# Patient Record
Sex: Female | Born: 1970
Health system: Southern US, Community
[De-identification: ages and names within clinical notes are randomized; demographics above are authoritative.]

## PROBLEM LIST (undated history)

## (undated) DIAGNOSIS — R112 Nausea with vomiting, unspecified: Secondary | ICD-10-CM

## (undated) DIAGNOSIS — B019 Varicella without complication: Secondary | ICD-10-CM

## (undated) DIAGNOSIS — Z9889 Other specified postprocedural states: Secondary | ICD-10-CM

## (undated) HISTORY — DX: Other specified postprocedural states: Z98.890

## (undated) HISTORY — DX: Varicella without complication: B01.9

## (undated) HISTORY — PX: TUBAL LIGATION: SHX77

## (undated) HISTORY — PX: HERNIA REPAIR: SHX51

## (undated) HISTORY — DX: Other specified postprocedural states: R11.2

---

## 1999-02-14 ENCOUNTER — Other Ambulatory Visit: Admission: RE | Admit: 1999-02-14 | Discharge: 1999-02-14 | Payer: Self-pay | Admitting: Obstetrics and Gynecology

## 1999-03-08 ENCOUNTER — Ambulatory Visit (HOSPITAL_BASED_OUTPATIENT_CLINIC_OR_DEPARTMENT_OTHER): Admission: RE | Admit: 1999-03-08 | Discharge: 1999-03-08 | Payer: Self-pay | Admitting: *Deleted

## 2002-05-29 ENCOUNTER — Other Ambulatory Visit: Admission: RE | Admit: 2002-05-29 | Discharge: 2002-05-29 | Payer: Self-pay | Admitting: Obstetrics and Gynecology

## 2004-08-09 ENCOUNTER — Ambulatory Visit: Payer: Self-pay | Admitting: Family Medicine

## 2004-08-09 ENCOUNTER — Other Ambulatory Visit: Admission: RE | Admit: 2004-08-09 | Discharge: 2004-08-09 | Payer: Self-pay | Admitting: Family Medicine

## 2005-02-08 ENCOUNTER — Ambulatory Visit: Payer: Self-pay | Admitting: Family Medicine

## 2012-04-01 ENCOUNTER — Encounter: Payer: Self-pay | Admitting: Family Medicine

## 2012-04-01 ENCOUNTER — Ambulatory Visit (INDEPENDENT_AMBULATORY_CARE_PROVIDER_SITE_OTHER): Payer: BC Managed Care – PPO | Admitting: Family Medicine

## 2012-04-01 VITALS — BP 128/76 | HR 100 | Temp 98.2°F | Ht 62.75 in | Wt 133.2 lb

## 2012-04-01 DIAGNOSIS — Z0000 Encounter for general adult medical examination without abnormal findings: Secondary | ICD-10-CM

## 2012-04-01 NOTE — Progress Notes (Signed)
  Subjective:    Patient ID: Jennifer Willis, female    DOB: 03-24-1971, 41 y.o.   MRN: [id number]  HPI New to establish.  Has not seen MD in 6-7 yrs.  Overdue on pap and mammo.  No concerns today.   Review of Systems Patient reports no vision/ hearing changes, adenopathy,fever, weight change,  persistant/recurrent hoarseness , swallowing issues, chest pain, palpitations, edema, persistant/recurrent cough, hemoptysis, dyspnea (rest/exertional/paroxysmal nocturnal), gastrointestinal bleeding (melena, rectal bleeding), abdominal pain, significant heartburn, bowel changes, GU symptoms (dysuria, hematuria, incontinence), Gyn symptoms (abnormal  bleeding, pain),  syncope, focal weakness, memory loss, numbness & tingling, skin/hair/nail changes, abnormal bruising or bleeding, anxiety, or depression.     Objective:   Physical Exam General Appearance:    Alert, cooperative, no distress, appears stated age  Head:    Normocephalic, without obvious abnormality, atraumatic  Eyes:    PERRL, conjunctiva/corneas clear, EOM's intact, fundi    benign, both eyes  Ears:    Normal TM's and external ear canals, both ears  Nose:   Nares normal, septum midline, mucosa normal, no drainage    or sinus tenderness  Throat:   Lips, mucosa, and tongue normal; teeth and gums normal  Neck:   Supple, symmetrical, trachea midline, no adenopathy;    Thyroid: no enlargement/tenderness/nodules  Back:     Symmetric, no curvature, ROM normal, no CVA tenderness  Lungs:     Clear to auscultation bilaterally, respirations unlabored  Chest Wall:    No tenderness or deformity   Heart:    Regular rate and rhythm, S1 and S2 normal, no murmur, rub   or gallop  Breast Exam:    Deferred to upcoming appt  Abdomen:     Soft, non-tender, bowel sounds active all four quadrants,    no masses, no organomegaly  Genitalia:    Deferred to upcoming appt  Rectal:    Extremities:   Extremities normal, atraumatic, no cyanosis or edema  Pulses:    2+ and symmetric all extremities  Skin:   Skin color, texture, turgor normal, no rashes or lesions  Lymph nodes:   Cervical, supraclavicular, and axillary nodes normal  Neurologic:   CNII-XII intact, normal strength, sensation and reflexes    throughout          Assessment & Plan:

## 2012-04-01 NOTE — Assessment & Plan Note (Signed)
New.  Pt's PE WNL.  Due for pap and mammo but will hold off as this is our first visit.  Check labs.  Anticipatory guidance provided.

## 2012-04-01 NOTE — Patient Instructions (Addendum)
Schedule your pap at your convenience We'll notify you of your lab results Keep up the good work!  You look great! Think of [place] as your home base Welcome!  We're glad to have you!!!

## 2012-04-02 LAB — CBC WITH DIFFERENTIAL/PLATELET
Basophils Relative: 0.8 % (ref 0.0–3.0)
Eosinophils Relative: 0.9 % (ref 0.0–5.0)
Lymphocytes Relative: 27.6 % (ref 12.0–46.0)
MCV: 76.2 fl — ABNORMAL LOW (ref 78.0–100.0)
Monocytes Relative: 7.2 % (ref 3.0–12.0)
Neutrophils Relative %: 63.5 % (ref 43.0–77.0)
Platelets: 354 10*3/uL (ref 150.0–400.0)
RBC: 4.59 Mil/uL (ref 3.87–5.11)
WBC: 8.2 10*3/uL (ref 4.5–10.5)

## 2012-04-02 LAB — HEPATIC FUNCTION PANEL
ALT: 29 U/L (ref 0–35)
AST: 27 U/L (ref 0–37)
Albumin: 4.3 g/dL (ref 3.5–5.2)
Alkaline Phosphatase: 53 U/L (ref 39–117)

## 2012-04-02 LAB — TSH: TSH: 1.3 u[IU]/mL (ref 0.35–5.50)

## 2012-04-02 LAB — BASIC METABOLIC PANEL
BUN: 15 mg/dL (ref 6–23)
CO2: 21 mEq/L (ref 19–32)
Chloride: 103 mEq/L (ref 96–112)
GFR: 99.03 mL/min (ref >60.00)
Glucose, Bld: 64 mg/dL — ABNORMAL LOW (ref 70–99)
Potassium: 3.2 mEq/L — ABNORMAL LOW (ref 3.5–5.1)
Sodium: 135 mEq/L (ref 135–145)

## 2012-04-02 LAB — LIPID PANEL: HDL: 54.5 mg/dL (ref >39.00)

## 2012-04-05 LAB — VITAMIN D 1,25 DIHYDROXY
Vitamin D 1, 25 (OH)2 Total: 74 pg/mL — ABNORMAL HIGH (ref 18–72)
Vitamin D2 1, 25 (OH)2: <8 pg/mL
Vitamin D3 1, 25 (OH)2: 74 pg/mL

## 2012-06-17 ENCOUNTER — Encounter: Payer: Self-pay | Admitting: Family Medicine

## 2012-06-17 ENCOUNTER — Ambulatory Visit (INDEPENDENT_AMBULATORY_CARE_PROVIDER_SITE_OTHER): Payer: BC Managed Care – PPO | Admitting: Family Medicine

## 2012-06-17 ENCOUNTER — Other Ambulatory Visit (HOSPITAL_COMMUNITY)
Admission: RE | Admit: 2012-06-17 | Discharge: 2012-06-17 | Disposition: A | Discharge status: Home / Self Care | Payer: BC Managed Care – PPO | Source: Ambulatory Visit | Attending: Family Medicine | Admitting: Family Medicine

## 2012-06-17 VITALS — BP 118/78 | HR 88 | Temp 98.2°F | Ht 62.75 in | Wt 131.6 lb

## 2012-06-17 DIAGNOSIS — Z1239 Encounter for other screening for malignant neoplasm of breast: Secondary | ICD-10-CM

## 2012-06-17 DIAGNOSIS — Z01419 Encounter for gynecological examination (general) (routine) without abnormal findings: Secondary | ICD-10-CM | POA: Insufficient documentation

## 2012-06-17 DIAGNOSIS — N920 Excessive and frequent menstruation with regular cycle: Secondary | ICD-10-CM

## 2012-06-17 DIAGNOSIS — Z124 Encounter for screening for malignant neoplasm of cervix: Secondary | ICD-10-CM

## 2012-06-17 DIAGNOSIS — Z1231 Encounter for screening mammogram for malignant neoplasm of breast: Secondary | ICD-10-CM

## 2012-06-17 NOTE — Assessment & Plan Note (Signed)
Breast and pelvic exam performed.  Referral for mammo made and discussed w/ pt.

## 2012-06-17 NOTE — Assessment & Plan Note (Signed)
Pap collected. 

## 2012-06-17 NOTE — Progress Notes (Signed)
  Subjective:    Patient ID: Jennifer Willis, female    DOB: [birth year], 41 y.o.   MRN: [id number]  HPI Here today for breast and pelvic exam.  Pt reports heavier than normal periods since starting iron.   Review of Systems Pt denies breast lump, skin changes, nipple discharge, discomfort.  No vaginal discharge, pain, mass, abnormality.    Objective:   Physical Exam  Vitals reviewed. Constitutional: She appears well-developed and well-nourished. No distress.  Pulmonary/Chest: Right breast exhibits no inverted nipple, no mass, no nipple discharge, no skin change and no tenderness. Left breast exhibits no inverted nipple, no mass, no nipple discharge, no skin change and no tenderness.  Genitourinary: There is no rash, tenderness or lesion on the right labia. There is no rash, tenderness or lesion on the left labia. Uterus is not deviated, not enlarged, not fixed and not tender. Cervix exhibits friability. Cervix exhibits no motion tenderness and no discharge. Right adnexum displays no mass, no tenderness and no fullness. Left adnexum displays no mass, no tenderness and no fullness. No erythema, tenderness or bleeding around the vagina. No foreign body around the vagina. No signs of injury around the vagina. No vaginal discharge found.          Assessment & Plan:

## 2012-06-17 NOTE — Patient Instructions (Addendum)
Follow up in 1 year or as needed We'll notify you of your pap result Someone will call you with your mammogram appt Call with any questions or concerns Happy Fall!!

## 2012-10-18 ENCOUNTER — Encounter: Payer: Self-pay | Admitting: Family Medicine

## 2012-10-18 ENCOUNTER — Ambulatory Visit (INDEPENDENT_AMBULATORY_CARE_PROVIDER_SITE_OTHER): Payer: BC Managed Care – PPO | Admitting: Family Medicine

## 2012-10-18 VITALS — BP 130/90 | HR 123 | Temp 101.7°F | Ht 62.5 in | Wt 133.2 lb

## 2012-10-18 DIAGNOSIS — J111 Influenza due to unidentified influenza virus with other respiratory manifestations: Secondary | ICD-10-CM

## 2012-10-18 LAB — POCT INFLUENZA A/B
Influenza A, POC: NEGATIVE
Influenza B, POC: NEGATIVE

## 2012-10-18 MED ORDER — OSELTAMIVIR PHOSPHATE 75 MG PO CAPS: 75.0000 mg | ORAL_CAPSULE | Freq: Two times a day (BID) | ORAL | Status: DC

## 2012-10-18 NOTE — Patient Instructions (Addendum)
This is a flu like illness Start the Tamiflu twice daily for 5 days Alternate tylenol and ibuprofen every 4 hrs for pain and fever relief Mucinex DM for cough Drink plenty of fluids REST! Hang in there!!

## 2012-10-18 NOTE — Progress Notes (Signed)
  Subjective:    Patient ID: Jennifer Willis, female    DOB: 08-03-1971, 42 y.o.   MRN: [id number]  HPI Cough- sxs started Tuesday.  + body aches, cough, fever.  Bilateral ear pain.  + HA.  Taking dayquil/nyquil.  No known sick contacts.  No N/V/D.  Denies sinus pain/pressure.   Review of Systems For ROS see HPI     Objective:   Physical Exam  Vitals reviewed. Constitutional: She appears well-developed and well-nourished. No distress.  Obviously not feeling well  HENT:  Head: Normocephalic and atraumatic.  Right Ear: Tympanic membrane normal.  Left Ear: Tympanic membrane normal.  Nose: Mucosal edema and rhinorrhea present. Right sinus exhibits no maxillary sinus tenderness and no frontal sinus tenderness. Left sinus exhibits no maxillary sinus tenderness and no frontal sinus tenderness.  Mouth/Throat: Uvula is midline and mucous membranes are normal. Posterior oropharyngeal erythema present. No oropharyngeal exudate.  Eyes: Conjunctivae and EOM are normal. Pupils are equal, round, and reactive to light.  Neck: Normal range of motion. Neck supple.  Cardiovascular: Normal rate, regular rhythm and normal heart sounds.   Pulmonary/Chest: Effort normal and breath sounds normal. No respiratory distress. She has no wheezes.  + dry cough  Lymphadenopathy:    She has no cervical adenopathy.  Skin: Skin is warm.          Assessment & Plan:

## 2012-10-20 NOTE — Assessment & Plan Note (Signed)
New.  Despite negative flu test, pt's sxs and lack of obvious bacterial infxn on PE consistent w/ ILI.  Start Tamiflu.  Reviewed supportive care and red flags that should prompt return.  Pt expressed understanding and is in agreement w/ plan.

## 2014-05-04 ENCOUNTER — Encounter: Payer: Self-pay | Admitting: Physician Assistant

## 2014-05-04 ENCOUNTER — Ambulatory Visit (INDEPENDENT_AMBULATORY_CARE_PROVIDER_SITE_OTHER): Payer: BC Managed Care – PPO | Admitting: Physician Assistant

## 2014-05-04 VITALS — BP 161/107 | HR 101 | Temp 98.7°F | Resp 16 | Ht 62.5 in | Wt 129.4 lb

## 2014-05-04 DIAGNOSIS — M5430 Sciatica, unspecified side: Secondary | ICD-10-CM

## 2014-05-04 DIAGNOSIS — M5432 Sciatica, left side: Secondary | ICD-10-CM

## 2014-05-04 MED ORDER — TRAMADOL HCL 50 MG PO TABS: ORAL_TABLET | ORAL | Status: DC

## 2014-05-04 MED ORDER — METHYLPREDNISOLONE ACETATE 80 MG/ML IJ SUSP
60.0000 mg | Freq: Once | INTRAMUSCULAR | Status: AC
  Administered 2014-05-04: 60 mg via INTRAMUSCULAR

## 2014-05-04 NOTE — Progress Notes (Signed)
Pre visit review using our clinic review tool, if applicable. No additional management support is needed unless otherwise documented below in the visit note/SLS  

## 2014-05-04 NOTE — Addendum Note (Signed)
Addended by: Rockwell Germany on: 05/04/2014 03:55 PM   Modules accepted: Orders

## 2014-05-04 NOTE — Progress Notes (Signed)
Patient presents to clinic today c/o left sided low back pain with radiation into the LLE.  Denies trauma or injury. Symptoms started the day after she worked in her garden.  Denies saddle anesthesia or change to bowel/bladder habits. Endorses some mild weakness of LLE but denies numbness or tingling of extremity.  Past Medical History  Diagnosis Date  . Chicken pox     No current outpatient prescriptions on file prior to visit.   No current facility-administered medications on file prior to visit.    No Known Allergies  Family History  Problem Relation Age of Onset  . Arthritis Mother   . Hypertension Mother     History   Social History  . Marital Status: Married    Spouse Name: N/A    Number of Children: N/A  . Years of Education: N/A   Social History Main Topics  . Smoking status: Never Smoker   . Smokeless tobacco: None  . Alcohol Use: No  . Drug Use: No  . Sexual Activity: None   Other Topics Concern  . None   Social History Narrative  . None   Review of Systems - See HPI.  All other ROS are negative.  BP 161/107  Pulse 101  Temp(Src) 98.7 F (37.1 C) (Oral)  Resp 16  Ht 5' 2.5" (1.588 m)  Wt 129 lb 6 oz (58.684 kg)  BMI 23.27 kg/m2  SpO2 100%  LMP 05/02/2014  Physical Exam  Vitals reviewed. Constitutional: She is oriented to person, place, and time and well-developed, well-nourished, and in no distress.  HENT:  Head: Normocephalic and atraumatic.  Cardiovascular: Normal rate, regular rhythm, normal heart sounds and intact distal pulses.   Pulmonary/Chest: Effort normal and breath sounds normal. No respiratory distress. She has no wheezes. She has no rales. She exhibits no tenderness.  Musculoskeletal:       Cervical back: Normal.       Thoracic back: Normal.       Lumbar back: She exhibits tenderness and pain. She exhibits no bony tenderness, no swelling and no spasm.  Neurological: She is alert and oriented to person, place, and time. She has  normal reflexes.  Skin: Skin is warm and dry. No rash noted.  Psychiatric: Affect normal.    No results found for this or any previous visit (from the past 2160 hour(s)).  Assessment/Plan: Sciatica 60 mg IM Depo medrol given by nursing staff.  Rx Tramadol.  Avoid heavy lifting or overexertion.  Topical Aspercreme and Heating pad. Follow-up in 1 week if no improvement.

## 2014-05-04 NOTE — Assessment & Plan Note (Signed)
60 mg IM Depo medrol given by nursing staff.  Rx Tramadol.  Avoid heavy lifting or overexertion.  Topical Aspercreme and Heating pad. Follow-up in 1 week if no improvement.

## 2014-05-04 NOTE — Patient Instructions (Signed)
Please take Tramadol as directed.  Avoid heavy lifting or overexertion.  Alternate Ice/Heat to the area.  Apply topical Aspercreme.  The steroid shot given should help calm down the nerve irritation I believe you are experiencing.  Follow-up in 1 weeks if symptoms still present.

## 2014-05-18 ENCOUNTER — Ambulatory Visit (INDEPENDENT_AMBULATORY_CARE_PROVIDER_SITE_OTHER): Payer: BC Managed Care – PPO | Admitting: Physician Assistant

## 2014-05-18 ENCOUNTER — Ambulatory Visit (HOSPITAL_BASED_OUTPATIENT_CLINIC_OR_DEPARTMENT_OTHER)
Admission: RE | Admit: 2014-05-18 | Discharge: 2014-05-18 | Disposition: A | Discharge status: Home / Self Care | Payer: BC Managed Care – PPO | Source: Ambulatory Visit | Attending: Physician Assistant | Admitting: Physician Assistant

## 2014-05-18 VITALS — BP 159/91 | HR 102 | Temp 98.4°F | Resp 14 | Ht 62.5 in | Wt 129.2 lb

## 2014-05-18 DIAGNOSIS — M25559 Pain in unspecified hip: Secondary | ICD-10-CM | POA: Insufficient documentation

## 2014-05-18 DIAGNOSIS — M238X9 Other internal derangements of unspecified knee: Secondary | ICD-10-CM | POA: Insufficient documentation

## 2014-05-18 DIAGNOSIS — M25552 Pain in left hip: Secondary | ICD-10-CM

## 2014-05-18 DIAGNOSIS — M25362 Other instability, left knee: Secondary | ICD-10-CM

## 2014-05-18 NOTE — Patient Instructions (Signed)
Please continue tramadol as needed for pain.  Wear elastic knee brace to add stabilization and compression to knee.  Avoid heavy lifting.    Please go downstairs for imaging.  I will call you with your results.  I am setting you up with a Sports Medicine doctor for further evaluation and management.

## 2014-05-18 NOTE — Progress Notes (Signed)
    Patient presents to clinic today c/o continued left hip and knee pain.  Endorses buckling of her left knee with prolonged standing.  Denies numbness of lower extremity.  Denies low back pain, stating it has resolved with steroid treatment. Denies trauma or injury.  Denies saddle anesthesia.  Denies change to bowel or bladder habits.  Past Medical History  Diagnosis Date  . Chicken pox     Current Outpatient Prescriptions on File Prior to Visit  Medication Sig Dispense Refill  . aspirin 81 MG tablet Take 81 mg by mouth daily.      Marland Kitchen FIBER SELECT GUMMIES PO Take by mouth daily.      . Ibuprofen-Diphenhydramine Cit (MOTRIN PM) 200-38 MG TABS Take by mouth at bedtime.      . Multiple Vitamins-Minerals (MULTIVITAMIN GUMMIES ADULT PO) Take by mouth.      . traMADol (ULTRAM) 50 MG tablet 1-2 tabs by mouth Q8 hours, maximum 6 tabs per day.  45 tablet  0   No current facility-administered medications on file prior to visit.    No Known Allergies  Family History  Problem Relation Age of Onset  . Arthritis Mother   . Hypertension Mother     History   Social History  . Marital Status: Married    Spouse Name: N/A    Number of Children: N/A  . Years of Education: N/A   Social History Main Topics  . Smoking status: Never Smoker   . Smokeless tobacco: Not on file  . Alcohol Use: No  . Drug Use: No  . Sexual Activity: Not on file   Other Topics Concern  . Not on file   Social History Narrative  . No narrative on file   Review of Systems - See HPI.  All other ROS are negative.  BP 159/91  Pulse 102  Temp(Src) 98.4 F (36.9 C) (Oral)  Resp 14  Ht 5' 2.5" (1.588 m)  Wt 129 lb 4 oz (58.627 kg)  BMI 23.25 kg/m2  SpO2 100%  LMP 05/02/2014  Physical Exam  Vitals reviewed. Constitutional: She is oriented to person, place, and time and well-developed, well-nourished, and in no distress.  HENT:  Head: Normocephalic and atraumatic.  Eyes: Conjunctivae are normal.  Neck:  Neck supple.  Cardiovascular: Normal rate, regular rhythm, normal heart sounds and intact distal pulses.   Pulmonary/Chest: Effort normal and breath sounds normal. No respiratory distress. She has no wheezes. She has no rales. She exhibits no tenderness.  Musculoskeletal:       Left hip: She exhibits normal range of motion, normal strength, no tenderness and no bony tenderness.       Left knee: She exhibits normal range of motion and no swelling. Tenderness found. Medial joint line tenderness noted.  Neurological: She is alert and oriented to person, place, and time.  Skin: Skin is warm and dry. No rash noted.  Psychiatric: Affect normal.   Assessment/Plan: Hip pain, acute Will obtain x-ray of left hip to further assess. Continue Tramadol for pain.  Referral placed to Sports medicine for further evaluation.  Knee buckling Will obtain x-ray of left knee.  Bracing applied.  RICE therapy.  Tramadol for pain.  Referral placed to Sports medicine for further assessment.  May also consider MRI pending X-ray results.

## 2014-05-18 NOTE — Progress Notes (Signed)
Pre visit review using our clinic review tool, if applicable. No additional management support is needed unless otherwise documented below in the visit note/SLS  

## 2014-05-19 ENCOUNTER — Encounter: Payer: Self-pay | Admitting: Physician Assistant

## 2014-05-19 DIAGNOSIS — M25559 Pain in unspecified hip: Secondary | ICD-10-CM | POA: Insufficient documentation

## 2014-05-19 DIAGNOSIS — M25369 Other instability, unspecified knee: Secondary | ICD-10-CM | POA: Insufficient documentation

## 2014-05-19 NOTE — Assessment & Plan Note (Signed)
Will obtain x-ray of left hip to further assess. Continue Tramadol for pain.  Referral placed to Sports medicine for further evaluation.

## 2014-05-19 NOTE — Assessment & Plan Note (Signed)
Will obtain x-ray of left knee.  Bracing applied.  RICE therapy.  Tramadol for pain.  Referral placed to Sports medicine for further assessment.  May also consider MRI pending X-ray results.

## 2014-05-21 ENCOUNTER — Ambulatory Visit: Payer: BC Managed Care – PPO | Admitting: Family Medicine

## 2014-05-25 ENCOUNTER — Ambulatory Visit (INDEPENDENT_AMBULATORY_CARE_PROVIDER_SITE_OTHER): Payer: BC Managed Care – PPO | Admitting: Family Medicine

## 2014-05-25 ENCOUNTER — Encounter: Payer: Self-pay | Admitting: Family Medicine

## 2014-05-25 ENCOUNTER — Ambulatory Visit (HOSPITAL_BASED_OUTPATIENT_CLINIC_OR_DEPARTMENT_OTHER)
Admission: RE | Admit: 2014-05-25 | Discharge: 2014-05-25 | Disposition: A | Discharge status: Home / Self Care | Payer: BC Managed Care – PPO | Source: Ambulatory Visit | Attending: Family Medicine | Admitting: Family Medicine

## 2014-05-25 VITALS — BP 150/98 | HR 106 | Ht 62.0 in | Wt 130.0 lb

## 2014-05-25 DIAGNOSIS — M25552 Pain in left hip: Secondary | ICD-10-CM | POA: Diagnosis not present

## 2014-05-25 DIAGNOSIS — M79605 Pain in left leg: Secondary | ICD-10-CM | POA: Insufficient documentation

## 2014-05-25 DIAGNOSIS — M6281 Muscle weakness (generalized): Secondary | ICD-10-CM | POA: Insufficient documentation

## 2014-05-25 DIAGNOSIS — R29898 Other symptoms and signs involving the musculoskeletal system: Secondary | ICD-10-CM

## 2014-05-25 NOTE — Patient Instructions (Signed)
I'm concerned you have a pinched nerve in your back that is causing severe weakness in your left leg and loss of a reflex at your knee. It's important we assess this with an MRI of your back to make sure it's nothing that needs surgery. Get x-rays before you leave today. Follow up with me the day after your MRI to go over the results and talk about next steps.

## 2014-05-26 ENCOUNTER — Encounter: Payer: Self-pay | Admitting: Family Medicine

## 2014-05-26 DIAGNOSIS — R29898 Other symptoms and signs involving the musculoskeletal system: Secondary | ICD-10-CM | POA: Insufficient documentation

## 2014-05-26 NOTE — Progress Notes (Addendum)
Patient ID: Jennifer Willis, female   DOB: [birth year], 43 y.o.   MRN: [id number]  PCP: Annye Asa, MD Referred by:  Elyn Aquas PA  Subjective:   HPI: Patient is a 43 y.o. female here for left leg, knee buckling.  Patient reports pain started around Labor Day when she felt pain in left buttocks. Pain worsened that week and radiated down to toes. No numbness/tingling. No bowel/bladder dysfunction. Saw Elyn Aquas - had an IM steroid injection and reports her pain has resolved but major issue now is weakness in left leg, left knee buckling. Quad downwards feels weak. Has a knee brace but not currently using. No catching, locking of knee. Radiographs of left hip and knee only showed mild medial arthritis of knee.  Past Medical History  Diagnosis Date  . Chicken pox     Current Outpatient Prescriptions on File Prior to Visit  Medication Sig Dispense Refill  . aspirin 81 MG tablet Take 81 mg by mouth daily.      Marland Kitchen FIBER SELECT GUMMIES PO Take by mouth daily.      . Ibuprofen-Diphenhydramine Cit (MOTRIN PM) 200-38 MG TABS Take by mouth at bedtime.      . Multiple Vitamins-Minerals (MULTIVITAMIN GUMMIES ADULT PO) Take by mouth.      . traMADol (ULTRAM) 50 MG tablet 1-2 tabs by mouth Q8 hours, maximum 6 tabs per day.  45 tablet  0   No current facility-administered medications on file prior to visit.    History reviewed. No pertinent past surgical history.  No Known Allergies  History   Social History  . Marital Status: Married    Spouse Name: N/A    Number of Children: N/A  . Years of Education: N/A   Occupational History  . Not on file.   Social History Main Topics  . Smoking status: Never Smoker   . Smokeless tobacco: Not on file  . Alcohol Use: No  . Drug Use: No  . Sexual Activity: Not on file   Other Topics Concern  . Not on file   Social History Narrative  . No narrative on file    Family History  Problem Relation Age of Onset  . Arthritis Mother    . Hypertension Mother     BP 150/98  Pulse 106  Ht 5\' 2"  (1.575 m)  Wt 130 lb (58.968 kg)  BMI 23.77 kg/m2  LMP 05/02/2014  Review of Systems: See HPI above.    Objective:  Physical Exam:  Gen: NAD  Back: No gross deformity, scoliosis. No TTP .  No midline or bony TTP. FROM without pain. Strength 3+/5 with left hip flexion and knee extension.  5/5 all other bilateral lower extremity groups. Absent left patellar reflex. 2+ right patellar, bilateral achilles tendon reflexes. Negative SLRs. Sensation intact to light touch bilaterally. Negative logroll bilateral hips Negative fabers and piriformis stretches.  Left knee: No gross deformity, ecchymoses, swelling.  No TTP. FROM. Negative ant/post drawers. Negative valgus/varus testing. Negative lachmanns. Negative mcmurrays, apleys, patellar apprehension.    Assessment & Plan:  1. Left leg weakness - given initial history of pain in left side of low back/buttocks radiating to foot and obvious current weakness of hip flexion and knee extension, worried about severe compression of lower lumbar nerve root.  Will go ahead with MRI to further assess (radiographs only mild DDD)  Follow up with me following MRI to go over results and next steps.  Addendum:  MRI lumbar spine reviewed and discussed  with patient in the office.  Only finding that could account for her weakness on left side is at L3-4 that could compress L3 nerve root - uncertain if this is a disc fragment or benign schwannoma or cyst.  Discussed considering neurosurgery referral vs MR with contrast to differentiate what this mass is vs neurology referral to consider further testing to likely include NCVs/EMGs.  As next step decided to refer to neurosurgery to get their input.

## 2014-05-26 NOTE — Assessment & Plan Note (Signed)
given initial history of pain in left side of low back/buttocks radiating to foot and obvious current weakness of hip flexion and knee extension, worried about severe compression of lower lumbar nerve root.  Will go ahead with MRI to further assess (radiographs only mild DDD)  Follow up with me following MRI to go over results and next steps.

## 2014-06-03 ENCOUNTER — Ambulatory Visit (HOSPITAL_BASED_OUTPATIENT_CLINIC_OR_DEPARTMENT_OTHER)
Admission: RE | Admit: 2014-06-03 | Discharge: 2014-06-03 | Disposition: A | Discharge status: Home / Self Care | Payer: BC Managed Care – PPO | Source: Ambulatory Visit | Attending: Family Medicine | Admitting: Family Medicine

## 2014-06-03 DIAGNOSIS — M79605 Pain in left leg: Secondary | ICD-10-CM | POA: Diagnosis not present

## 2014-06-03 DIAGNOSIS — M545 Low back pain: Secondary | ICD-10-CM | POA: Insufficient documentation

## 2014-06-05 ENCOUNTER — Encounter: Payer: BC Managed Care – PPO | Admitting: Family Medicine

## 2014-06-09 NOTE — Progress Notes (Signed)
This encounter was created in error - please disregard.

## 2014-06-25 ENCOUNTER — Other Ambulatory Visit (HOSPITAL_BASED_OUTPATIENT_CLINIC_OR_DEPARTMENT_OTHER): Payer: Self-pay | Admitting: Neurosurgery

## 2014-06-25 DIAGNOSIS — M5410 Radiculopathy, site unspecified: Secondary | ICD-10-CM

## 2014-07-01 ENCOUNTER — Ambulatory Visit (HOSPITAL_BASED_OUTPATIENT_CLINIC_OR_DEPARTMENT_OTHER)
Admission: RE | Admit: 2014-07-01 | Discharge: 2014-07-01 | Disposition: A | Discharge status: Home / Self Care | Payer: BC Managed Care – PPO | Source: Ambulatory Visit | Attending: Neurosurgery | Admitting: Neurosurgery

## 2014-07-01 DIAGNOSIS — M488X6 Other specified spondylopathies, lumbar region: Secondary | ICD-10-CM | POA: Diagnosis not present

## 2014-07-01 DIAGNOSIS — R531 Weakness: Secondary | ICD-10-CM | POA: Diagnosis present

## 2014-07-01 DIAGNOSIS — M5410 Radiculopathy, site unspecified: Secondary | ICD-10-CM

## 2014-07-01 DIAGNOSIS — M545 Low back pain: Secondary | ICD-10-CM | POA: Diagnosis present

## 2014-07-01 DIAGNOSIS — M79605 Pain in left leg: Secondary | ICD-10-CM | POA: Diagnosis present

## 2014-07-01 MED ORDER — GADOBENATE DIMEGLUMINE 529 MG/ML IV SOLN: 15.0000 mL | Freq: Once | INTRAVENOUS | Status: AC | PRN

## 2014-08-28 ENCOUNTER — Encounter: Payer: Self-pay | Admitting: Family Medicine

## 2014-08-28 ENCOUNTER — Ambulatory Visit (INDEPENDENT_AMBULATORY_CARE_PROVIDER_SITE_OTHER): Payer: BLUE CROSS/BLUE SHIELD | Admitting: Family Medicine

## 2014-08-28 VITALS — BP 130/84 | HR 87 | Temp 98.1°F | Resp 16 | Ht 62.5 in | Wt 136.2 lb

## 2014-08-28 DIAGNOSIS — Z1231 Encounter for screening mammogram for malignant neoplasm of breast: Secondary | ICD-10-CM

## 2014-08-28 DIAGNOSIS — Z0000 Encounter for general adult medical examination without abnormal findings: Secondary | ICD-10-CM | POA: Insufficient documentation

## 2014-08-28 LAB — CBC WITH DIFFERENTIAL/PLATELET
BASOS ABS: 0 10*3/uL (ref 0.0–0.1)
Basophils Relative: 0.6 % (ref 0.0–3.0)
EOS PCT: 1 % (ref 0.0–5.0)
Eosinophils Absolute: 0.1 10*3/uL (ref 0.0–0.7)
HCT: 35.2 % — ABNORMAL LOW (ref 36.0–46.0)
HEMOGLOBIN: 11 g/dL — AB (ref 12.0–15.0)
Lymphocytes Relative: 34.5 % (ref 12.0–46.0)
Lymphs Abs: 2.4 10*3/uL (ref 0.7–4.0)
MCHC: 31.3 g/dL (ref 30.0–36.0)
MCV: 73.8 fl — ABNORMAL LOW (ref 78.0–100.0)
Monocytes Absolute: 0.6 10*3/uL (ref 0.1–1.0)
Monocytes Relative: 8.5 % (ref 3.0–12.0)
Neutro Abs: 3.9 10*3/uL (ref 1.4–7.7)
Neutrophils Relative %: 55.4 % (ref 43.0–77.0)
Platelets: 396 10*3/uL (ref 150.0–400.0)
RBC: 4.76 Mil/uL (ref 3.87–5.11)
RDW: 15.3 % (ref 11.5–15.5)
WBC: 7.1 10*3/uL (ref 4.0–10.5)

## 2014-08-28 LAB — BASIC METABOLIC PANEL
BUN: 16 mg/dL (ref 6–23)
CHLORIDE: 106 meq/L (ref 96–112)
CO2: 25 mEq/L (ref 19–32)
Calcium: 9.1 mg/dL (ref 8.4–10.5)
Creatinine, Ser: 0.6 mg/dL (ref 0.4–1.2)
GFR: 122.08 mL/min (ref >60.00)
Glucose, Bld: 82 mg/dL (ref 70–99)
Potassium: 3.8 mEq/L (ref 3.5–5.1)
SODIUM: 138 meq/L (ref 135–145)

## 2014-08-28 LAB — HEPATIC FUNCTION PANEL
ALBUMIN: 4.2 g/dL (ref 3.5–5.2)
ALK PHOS: 57 U/L (ref 39–117)
ALT: 30 U/L (ref 0–35)
AST: 27 U/L (ref 0–37)
BILIRUBIN TOTAL: 0.5 mg/dL (ref 0.2–1.2)
Bilirubin, Direct: 0 mg/dL (ref 0.0–0.3)
Total Protein: 7.7 g/dL (ref 6.0–8.3)

## 2014-08-28 LAB — LIPID PANEL
Cholesterol: 214 mg/dL — ABNORMAL HIGH (ref 0–200)
HDL: 58.1 mg/dL (ref >39.00)
LDL Cholesterol: 136 mg/dL — ABNORMAL HIGH (ref 0–99)
NonHDL: 155.9
TRIGLYCERIDES: 99 mg/dL (ref 0.0–149.0)
Total CHOL/HDL Ratio: 4
VLDL: 19.8 mg/dL (ref 0.0–40.0)

## 2014-08-28 LAB — VITAMIN D 25 HYDROXY (VIT D DEFICIENCY, FRACTURES): VITD: 23.44 ng/mL — AB (ref 30.00–100.00)

## 2014-08-28 LAB — TSH: TSH: 1.22 u[IU]/mL (ref 0.35–4.50)

## 2014-08-28 NOTE — Progress Notes (Signed)
   Subjective:    Patient ID: Jennifer Willis, female    DOB: 07/22/1971, 44 y.o.   MRN: [id number]  HPI CPE- pt is overdue for mammo.   Review of Systems Patient reports no vision/ hearing changes, adenopathy,fever, weight change,  persistant/recurrent hoarseness , swallowing issues, chest pain, palpitations, edema, persistant/recurrent cough, hemoptysis, dyspnea (rest/exertional/paroxysmal nocturnal), gastrointestinal bleeding (melena, rectal bleeding), abdominal pain, significant heartburn, bowel changes, GU symptoms (dysuria, hematuria, incontinence), Gyn symptoms (abnormal  bleeding, pain),  syncope, focal weakness, memory loss, numbness & tingling, skin/hair/nail changes, abnormal bruising or bleeding, anxiety, or depression.     Objective:   Physical Exam General Appearance:    Alert, cooperative, no distress, appears stated age  Head:    Normocephalic, without obvious abnormality, atraumatic  Eyes:    PERRL, conjunctiva/corneas clear, EOM's intact, fundi    benign, both eyes  Ears:    Normal TM's and external ear canals, both ears  Nose:   Nares normal, septum midline, mucosa normal, no drainage    or sinus tenderness  Throat:   Lips, mucosa, and tongue normal; teeth and gums normal  Neck:   Supple, symmetrical, trachea midline, no adenopathy;    Thyroid: no enlargement/tenderness/nodules  Back:     Symmetric, no curvature, ROM normal, no CVA tenderness  Lungs:     Clear to auscultation bilaterally, respirations unlabored  Chest Wall:    No tenderness or deformity   Heart:    Regular rate and rhythm, S1 and S2 normal, no murmur, rub   or gallop  Breast Exam:    Deferred  Abdomen:     Soft, non-tender, bowel sounds active all four quadrants,    no masses, no organomegaly  Genitalia:    Deferred until next year at pt's request  Rectal:    Extremities:   Extremities normal, atraumatic, no cyanosis or edema  Pulses:   2+ and symmetric all extremities  Skin:   Skin color, texture,  turgor normal, no rashes or lesions  Lymph nodes:   Cervical, supraclavicular, and axillary nodes normal  Neurologic:   CNII-XII intact, normal strength, sensation and reflexes    throughout          Assessment & Plan:

## 2014-08-28 NOTE — Assessment & Plan Note (Signed)
Pt's PE WNL.  Due for mammo- order entered.  Pap will be next year at pt's request.  Check labs.  Anticipatory guidance provided.

## 2014-08-28 NOTE — Patient Instructions (Signed)
Follow up in 1 year or as needed We'll notify you of your lab results and make any changes if needed Keep up the good work on healthy diet and regular exercise We'll call you with your mammogram appt Call with any questions or concerns Happy New Year!!!

## 2014-08-28 NOTE — Progress Notes (Signed)
Pre visit review using our clinic review tool, if applicable. No additional management support is needed unless otherwise documented below in the visit note. 

## 2014-08-31 ENCOUNTER — Other Ambulatory Visit: Payer: Self-pay | Admitting: General Practice

## 2014-08-31 ENCOUNTER — Encounter: Payer: Self-pay | Admitting: General Practice

## 2014-08-31 MED ORDER — VITAMIN D (ERGOCALCIFEROL) 1.25 MG (50000 UNIT) PO CAPS: 50000.0000 [IU] | ORAL_CAPSULE | ORAL | Status: DC

## 2015-09-01 ENCOUNTER — Encounter: Payer: BLUE CROSS/BLUE SHIELD | Admitting: Family Medicine

## 2015-10-20 ENCOUNTER — Encounter: Payer: Self-pay | Admitting: *Deleted

## 2015-10-20 ENCOUNTER — Telehealth: Payer: Self-pay | Admitting: *Deleted

## 2015-10-20 NOTE — Telephone Encounter (Signed)
Pt c/o of fever, chills, body aches, and cough x3-4 days. CPE changed to acute visit per pt request. Pt will need to reschedule CPE when in office tomorrow.

## 2015-10-21 ENCOUNTER — Encounter: Payer: Self-pay | Admitting: Family Medicine

## 2015-10-21 ENCOUNTER — Ambulatory Visit (INDEPENDENT_AMBULATORY_CARE_PROVIDER_SITE_OTHER): Payer: BLUE CROSS/BLUE SHIELD | Admitting: Family Medicine

## 2015-10-21 VITALS — BP 136/86 | HR 106 | Temp 100.5°F | Resp 17 | Ht 63.0 in | Wt 132.5 lb

## 2015-10-21 DIAGNOSIS — R509 Fever, unspecified: Secondary | ICD-10-CM | POA: Diagnosis not present

## 2015-10-21 DIAGNOSIS — J029 Acute pharyngitis, unspecified: Secondary | ICD-10-CM

## 2015-10-21 DIAGNOSIS — J020 Streptococcal pharyngitis: Secondary | ICD-10-CM | POA: Insufficient documentation

## 2015-10-21 LAB — POCT INFLUENZA A/B
Influenza A, POC: NEGATIVE
Influenza B, POC: NEGATIVE

## 2015-10-21 LAB — POCT RAPID STREP A (OFFICE): Rapid Strep A Screen: POSITIVE — AB

## 2015-10-21 MED ORDER — AMOXICILLIN 875 MG PO TABS: 875.0000 mg | ORAL_TABLET | Freq: Two times a day (BID) | ORAL | Status: DC

## 2015-10-21 MED ORDER — BENZONATATE 200 MG PO CAPS: 200.0000 mg | ORAL_CAPSULE | Freq: Three times a day (TID) | ORAL | Status: DC | PRN

## 2015-10-21 NOTE — Progress Notes (Signed)
   Subjective:    Patient ID: Burna Phok-Hean, female    DOB: [birth year], 45 y.o.   MRN: [id number]  HPI URI- pt reports fever to 103, nonproductive cough, sore throat.  sxs started on Sunday w/ itchy throat.  Pt developed fevers Monday.  Body aches started Tuesday.  Denies HA, ear fullness, sick contacts.  No N/V.  No abd pain.   Review of Systems For ROS see HPI     Objective:   Physical Exam  Constitutional: She appears well-developed and well-nourished. No distress.  HENT:  Head: Normocephalic and atraumatic.  Nose: Nose normal.  TMs normal bilaterally No TTP over sinuses Posterior pharynx w/ erythema and exudate  Neck: Normal range of motion. Neck supple.  Cardiovascular: Normal rate, regular rhythm and normal heart sounds.   Pulmonary/Chest: Effort normal and breath sounds normal. No respiratory distress. She has no wheezes. She has no rales.  Lymphadenopathy:    She has cervical adenopathy.  Vitals reviewed.         Assessment & Plan:

## 2015-10-21 NOTE — Progress Notes (Signed)
Pre visit review using our clinic review tool, if applicable. No additional management support is needed unless otherwise documented below in the visit note. 

## 2015-10-21 NOTE — Assessment & Plan Note (Signed)
Pt's sxs and PE are consistent w/ strep and confirmed w/ rapid strep test.  (-) flu test.  Start amoxicillin twice daily.  Cough meds prn.  Reviewed supportive care and red flags that should prompt return.  Pt expressed understanding and is in agreement w/ plan.

## 2015-10-21 NOTE — Patient Instructions (Signed)
Follow up as needed You have strep Start the Amoxicillin twice daily- take w/ food Drink plenty of fluids REST! Use the cough pills as needed Alternate tylenol/ibuprofen as needed for pain/fever Call with any questions or concerns Hang in there!!!

## 2016-03-21 IMAGING — CR DG HIP (WITH OR WITHOUT PELVIS) 2-3V*L*
3 series · 3 of 3 positions shown · non-contrast
Comparison: None.

CLINICAL DATA: Left hip pain for 2 weeks

EXAM:
LEFT HIP - COMPLETE 2+ VIEW

[t pelvis a.p.]
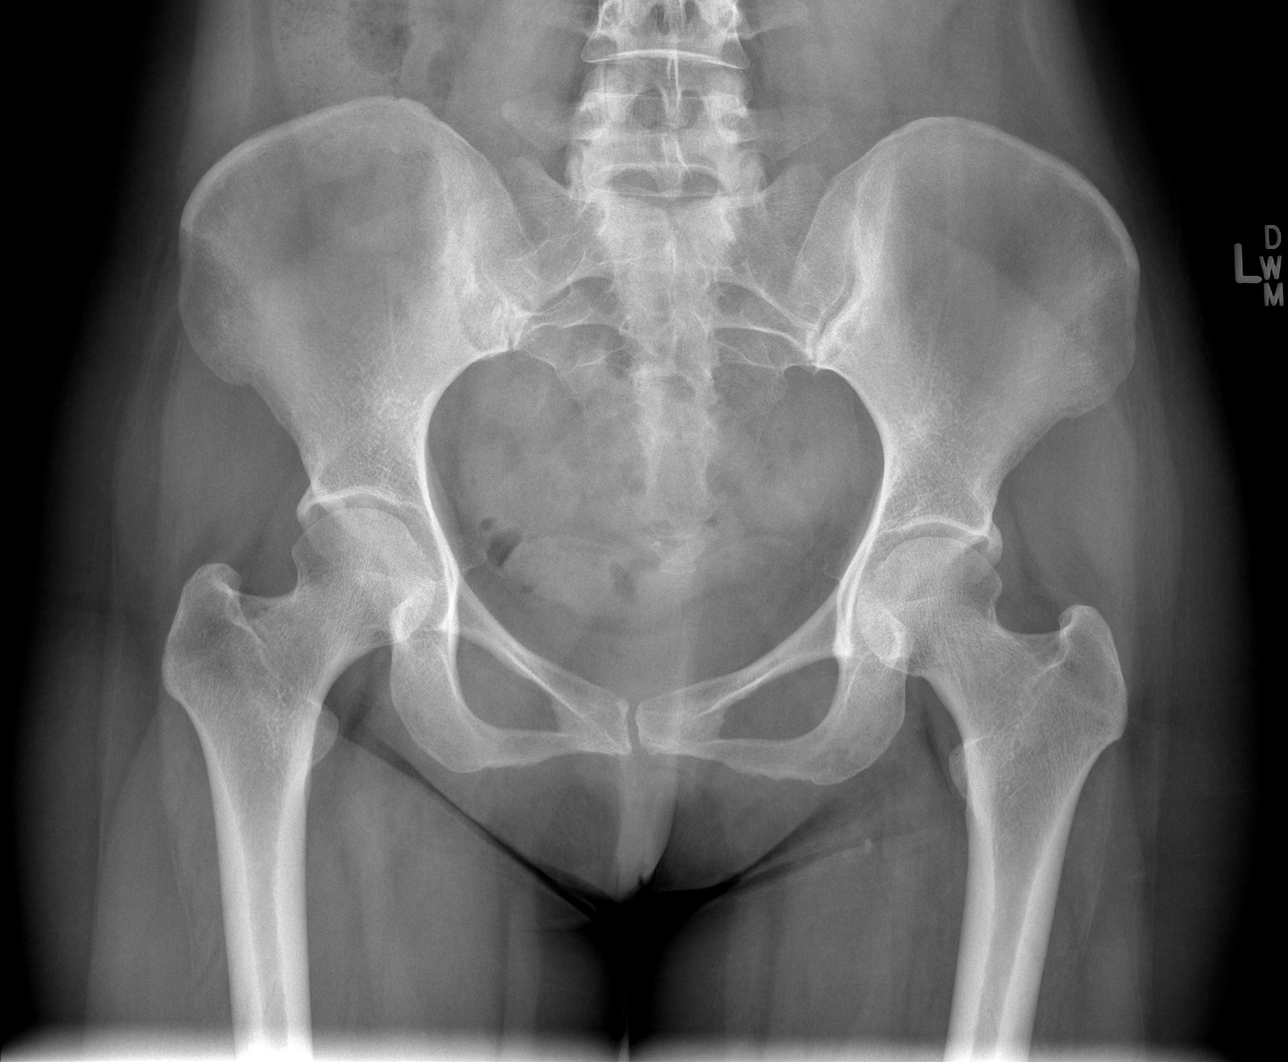

[t hip ap left]
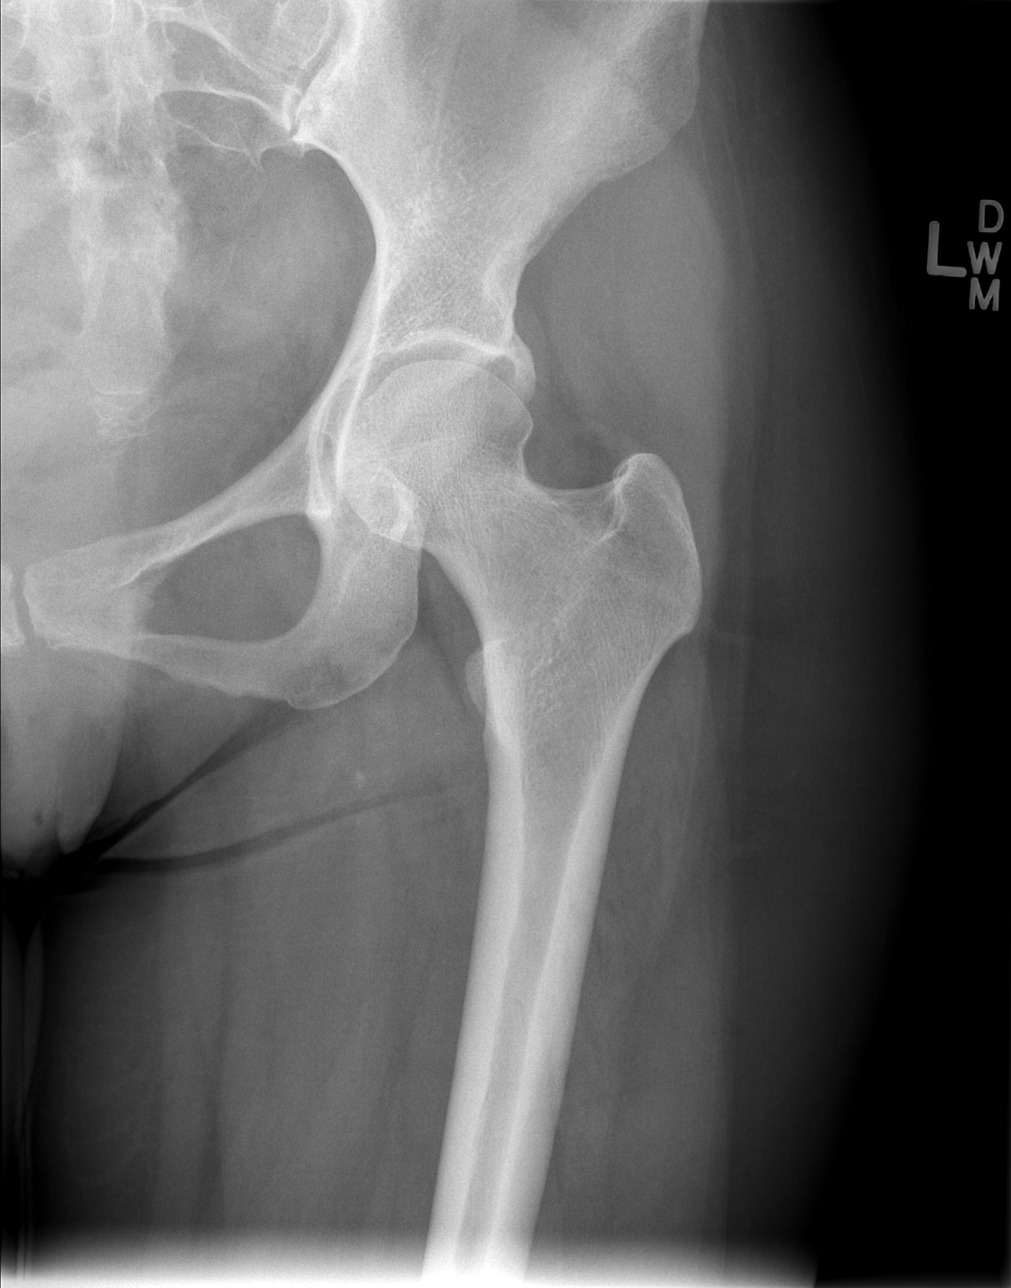

[t hip frog leg left]
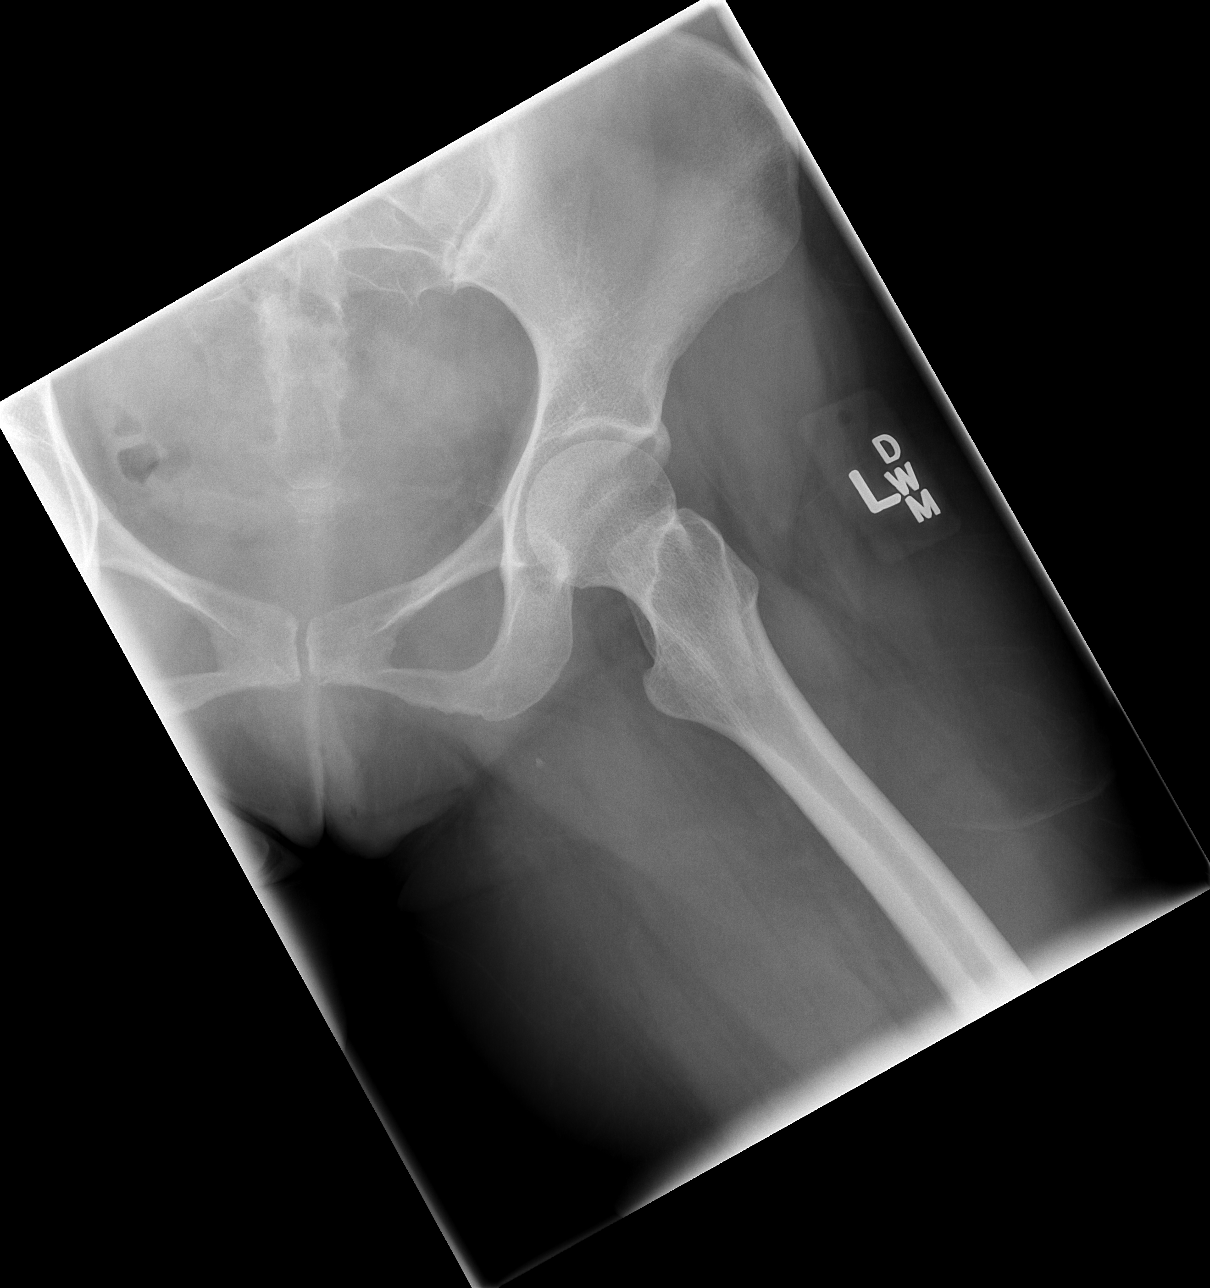

[3 of 3 positions shown; findings below may reference images not displayed]

FINDINGS: There is no evidence of hip fracture or dislocation. There is no
evidence of arthropathy or other focal bone abnormality.
IMPRESSION: Negative.

## 2016-09-06 ENCOUNTER — Encounter: Payer: BLUE CROSS/BLUE SHIELD | Admitting: Family Medicine

## 2016-09-29 ENCOUNTER — Other Ambulatory Visit (HOSPITAL_COMMUNITY)
Admission: RE | Admit: 2016-09-29 | Discharge: 2016-09-29 | Disposition: A | Discharge status: Home / Self Care | Payer: BLUE CROSS/BLUE SHIELD | Source: Ambulatory Visit | Attending: Family Medicine | Admitting: Family Medicine

## 2016-09-29 ENCOUNTER — Ambulatory Visit (INDEPENDENT_AMBULATORY_CARE_PROVIDER_SITE_OTHER): Payer: BLUE CROSS/BLUE SHIELD | Admitting: Family Medicine

## 2016-09-29 ENCOUNTER — Encounter: Payer: Self-pay | Admitting: Family Medicine

## 2016-09-29 VITALS — BP 128/86 | HR 74 | Temp 98.5°F | Resp 16 | Ht 63.0 in | Wt 138.1 lb

## 2016-09-29 DIAGNOSIS — Z1151 Encounter for screening for human papillomavirus (HPV): Secondary | ICD-10-CM | POA: Diagnosis present

## 2016-09-29 DIAGNOSIS — Z124 Encounter for screening for malignant neoplasm of cervix: Secondary | ICD-10-CM | POA: Diagnosis not present

## 2016-09-29 DIAGNOSIS — Z23 Encounter for immunization: Secondary | ICD-10-CM

## 2016-09-29 DIAGNOSIS — Z1231 Encounter for screening mammogram for malignant neoplasm of breast: Secondary | ICD-10-CM | POA: Diagnosis not present

## 2016-09-29 DIAGNOSIS — Z01419 Encounter for gynecological examination (general) (routine) without abnormal findings: Secondary | ICD-10-CM

## 2016-09-29 DIAGNOSIS — Z0000 Encounter for general adult medical examination without abnormal findings: Secondary | ICD-10-CM

## 2016-09-29 LAB — CBC WITH DIFFERENTIAL/PLATELET
Basophils Absolute: 0.1 10*3/uL (ref 0.0–0.1)
Basophils Relative: 0.6 % (ref 0.0–3.0)
EOS PCT: 1 % (ref 0.0–5.0)
Eosinophils Absolute: 0.1 10*3/uL (ref 0.0–0.7)
HEMATOCRIT: 33.8 % — AB (ref 36.0–46.0)
Hemoglobin: 10.9 g/dL — ABNORMAL LOW (ref 12.0–15.0)
Lymphocytes Relative: 26.9 % (ref 12.0–46.0)
Lymphs Abs: 2.8 10*3/uL (ref 0.7–4.0)
MCHC: 32.3 g/dL (ref 30.0–36.0)
MONOS PCT: 9.3 % (ref 3.0–12.0)
Monocytes Absolute: 1 10*3/uL (ref 0.1–1.0)
NEUTROS ABS: 6.5 10*3/uL (ref 1.4–7.7)
Neutrophils Relative %: 62.2 % (ref 43.0–77.0)
Platelets: 339 10*3/uL (ref 150.0–400.0)
RBC: 4.87 Mil/uL (ref 3.87–5.11)
RDW: 19.2 % — ABNORMAL HIGH (ref 11.5–15.5)
WBC: 10.4 10*3/uL (ref 4.0–10.5)

## 2016-09-29 LAB — LIPID PANEL
CHOL/HDL RATIO: 3
Cholesterol: 193 mg/dL (ref 0–200)
HDL: 64.9 mg/dL (ref >39.00)
LDL CALC: 112 mg/dL — AB (ref 0–99)
NONHDL: 128.01
Triglycerides: 79 mg/dL (ref 0.0–149.0)
VLDL: 15.8 mg/dL (ref 0.0–40.0)

## 2016-09-29 LAB — BASIC METABOLIC PANEL
BUN: 14 mg/dL (ref 6–23)
CO2: 29 meq/L (ref 19–32)
CREATININE: 0.58 mg/dL (ref 0.40–1.20)
Calcium: 9.6 mg/dL (ref 8.4–10.5)
Chloride: 103 mEq/L (ref 96–112)
GFR: 118.55 mL/min (ref >60.00)
Glucose, Bld: 121 mg/dL — ABNORMAL HIGH (ref 70–99)
Potassium: 4 mEq/L (ref 3.5–5.1)
SODIUM: 137 meq/L (ref 135–145)

## 2016-09-29 LAB — HEPATIC FUNCTION PANEL
ALT: 27 U/L (ref 0–35)
AST: 21 U/L (ref 0–37)
Albumin: 4.4 g/dL (ref 3.5–5.2)
Alkaline Phosphatase: 55 U/L (ref 39–117)
BILIRUBIN TOTAL: 0.4 mg/dL (ref 0.2–1.2)
Bilirubin, Direct: 0.1 mg/dL (ref 0.0–0.3)
Total Protein: 7.3 g/dL (ref 6.0–8.3)

## 2016-09-29 LAB — VITAMIN D 25 HYDROXY (VIT D DEFICIENCY, FRACTURES): VITD: 13.09 ng/mL — ABNORMAL LOW (ref 30.00–100.00)

## 2016-09-29 LAB — TSH: TSH: 1.5 u[IU]/mL (ref 0.35–4.50)

## 2016-09-29 NOTE — Addendum Note (Signed)
Addended by: Davis Gourd on: 09/29/2016 01:47 PM   Modules accepted: Orders

## 2016-09-29 NOTE — Progress Notes (Signed)
   Subjective:    Patient ID: Jennifer Willis, female    DOB: 21-Nov-1970, 46 y.o.   MRN: [id number]  HPI CPE- due for pap, mammo, and Tdap.  No concerns today   Review of Systems Patient reports no vision/ hearing changes, adenopathy,fever, weight change,  persistant/recurrent hoarseness , swallowing issues, chest pain, palpitations, edema, persistant/recurrent cough, hemoptysis, dyspnea (rest/exertional/paroxysmal nocturnal), gastrointestinal bleeding (melena, rectal bleeding), abdominal pain, significant heartburn, bowel changes, GU symptoms (dysuria, hematuria, incontinence), Gyn symptoms (abnormal  bleeding, pain),  syncope, focal weakness, memory loss, numbness & tingling, skin/hair/nail changes, abnormal bruising or bleeding, anxiety, or depression.     Objective:   Physical Exam  General Appearance:    Alert, cooperative, no distress, appears stated age  Head:    Normocephalic, without obvious abnormality, atraumatic  Eyes:    PERRL, conjunctiva/corneas clear, EOM's intact, fundi    benign, both eyes  Ears:    Normal TM's and external ear canals, both ears  Nose:   Nares normal, septum midline, mucosa normal, no drainage    or sinus tenderness  Throat:   Lips, mucosa, and tongue normal; teeth and gums normal  Neck:   Supple, symmetrical, trachea midline, no adenopathy;    Thyroid: no enlargement/tenderness/nodules  Back:     Symmetric, no curvature, ROM normal, no CVA tenderness  Lungs:     Clear to auscultation bilaterally, respirations unlabored  Chest Wall:    No tenderness or deformity   Heart:    Regular rate and rhythm, S1 and S2 normal, no murmur, rub   or gallop  Breast Exam:    No tenderness, masses, or nipple abnormality  Abdomen:     Soft, non-tender, bowel sounds active all four quadrants,    no masses, no organomegaly  Genitalia:    External genitalia normal, cervix erythematous and friable, no CMT, uterus in normal size and position, adnexa w/out mass or tenderness,  mucosa pink and moist, no lesions or discharge present  Rectal:    Normal external appearance  Extremities:   Extremities normal, atraumatic, no cyanosis or edema  Pulses:   2+ and symmetric all extremities  Skin:   Skin color, texture, turgor normal, no rashes or lesions  Lymph nodes:   Cervical, supraclavicular, and axillary nodes normal  Neurologic:   CNII-XII intact, normal strength, sensation and reflexes    throughout          Assessment & Plan:

## 2016-09-29 NOTE — Progress Notes (Signed)
Pre visit review using our clinic review tool, if applicable. No additional management support is needed unless otherwise documented below in the visit note. 

## 2016-09-29 NOTE — Assessment & Plan Note (Signed)
Pap collected. 

## 2016-09-29 NOTE — Patient Instructions (Signed)
Follow up in 1 year or as needed We'll notify you of your lab results and make any changes if needed Continue to work on healthy diet and regular exercise- you look great! We'll call you with your mammogram appt at the [facility] office Call with any questions or concerns Happy Valentine's Day!!!

## 2016-09-29 NOTE — Assessment & Plan Note (Signed)
Pt's PE WNL w/ exception of friable cervix.  Overdue for mammo- order entered again.  Tdap updated.  Check labs.  Anticipatory guidance provided.

## 2016-10-02 ENCOUNTER — Other Ambulatory Visit (INDEPENDENT_AMBULATORY_CARE_PROVIDER_SITE_OTHER): Payer: BLUE CROSS/BLUE SHIELD

## 2016-10-02 ENCOUNTER — Other Ambulatory Visit: Payer: Self-pay | Admitting: General Practice

## 2016-10-02 DIAGNOSIS — R7309 Other abnormal glucose: Secondary | ICD-10-CM

## 2016-10-02 LAB — HEMOGLOBIN A1C: Hgb A1c MFr Bld: 4.6 % (ref 4.6–6.5)

## 2016-10-02 MED ORDER — VITAMIN D (ERGOCALCIFEROL) 1.25 MG (50000 UNIT) PO CAPS: 50000.0000 [IU] | ORAL_CAPSULE | ORAL | 0 refills | Status: DC

## 2016-10-03 LAB — CYTOLOGY - PAP
DIAGNOSIS: NEGATIVE
HPV: NOT DETECTED

## 2016-10-09 ENCOUNTER — Ambulatory Visit (HOSPITAL_BASED_OUTPATIENT_CLINIC_OR_DEPARTMENT_OTHER)
Admission: RE | Admit: 2016-10-09 | Discharge: 2016-10-09 | Disposition: A | Discharge status: Home / Self Care | Payer: BLUE CROSS/BLUE SHIELD | Source: Ambulatory Visit | Attending: Family Medicine | Admitting: Family Medicine

## 2016-10-09 ENCOUNTER — Encounter (HOSPITAL_BASED_OUTPATIENT_CLINIC_OR_DEPARTMENT_OTHER): Payer: Self-pay

## 2016-10-09 DIAGNOSIS — Z1231 Encounter for screening mammogram for malignant neoplasm of breast: Secondary | ICD-10-CM

## 2017-10-01 ENCOUNTER — Ambulatory Visit (INDEPENDENT_AMBULATORY_CARE_PROVIDER_SITE_OTHER): Payer: BLUE CROSS/BLUE SHIELD | Admitting: Family Medicine

## 2017-10-01 ENCOUNTER — Other Ambulatory Visit: Payer: Self-pay

## 2017-10-01 ENCOUNTER — Encounter: Payer: Self-pay | Admitting: Family Medicine

## 2017-10-01 VITALS — BP 120/82 | HR 98 | Temp 98.1°F | Resp 16 | Ht 63.0 in | Wt 139.5 lb

## 2017-10-01 DIAGNOSIS — Z23 Encounter for immunization: Secondary | ICD-10-CM | POA: Diagnosis not present

## 2017-10-01 DIAGNOSIS — E559 Vitamin D deficiency, unspecified: Secondary | ICD-10-CM | POA: Diagnosis not present

## 2017-10-01 DIAGNOSIS — E785 Hyperlipidemia, unspecified: Secondary | ICD-10-CM

## 2017-10-01 DIAGNOSIS — Z0000 Encounter for general adult medical examination without abnormal findings: Secondary | ICD-10-CM

## 2017-10-01 DIAGNOSIS — Z1231 Encounter for screening mammogram for malignant neoplasm of breast: Secondary | ICD-10-CM | POA: Diagnosis not present

## 2017-10-01 LAB — HEPATIC FUNCTION PANEL
ALBUMIN: 4.4 g/dL (ref 3.5–5.2)
ALK PHOS: 53 U/L (ref 39–117)
ALT: 28 U/L (ref 0–35)
AST: 22 U/L (ref 0–37)
Bilirubin, Direct: 0.1 mg/dL (ref 0.0–0.3)
TOTAL PROTEIN: 7.8 g/dL (ref 6.0–8.3)
Total Bilirubin: 0.3 mg/dL (ref 0.2–1.2)

## 2017-10-01 LAB — CBC WITH DIFFERENTIAL/PLATELET
BASOS ABS: 0.1 10*3/uL (ref 0.0–0.1)
Basophils Relative: 1.1 % (ref 0.0–3.0)
Eosinophils Absolute: 0.1 10*3/uL (ref 0.0–0.7)
Eosinophils Relative: 1.6 % (ref 0.0–5.0)
HCT: 35.8 % — ABNORMAL LOW (ref 36.0–46.0)
HEMOGLOBIN: 11.5 g/dL — AB (ref 12.0–15.0)
Lymphocytes Relative: 31.4 % (ref 12.0–46.0)
Lymphs Abs: 2.3 10*3/uL (ref 0.7–4.0)
MCHC: 32.2 g/dL (ref 30.0–36.0)
MCV: 70.7 fl — ABNORMAL LOW (ref 78.0–100.0)
MONOS PCT: 7.8 % (ref 3.0–12.0)
Monocytes Absolute: 0.6 10*3/uL (ref 0.1–1.0)
NEUTROS PCT: 58.1 % (ref 43.0–77.0)
Neutro Abs: 4.2 10*3/uL (ref 1.4–7.7)
Platelets: 387 10*3/uL (ref 150.0–400.0)
RBC: 5.06 Mil/uL (ref 3.87–5.11)
RDW: 20 % — ABNORMAL HIGH (ref 11.5–15.5)
WBC: 7.2 10*3/uL (ref 4.0–10.5)

## 2017-10-01 LAB — LIPID PANEL
CHOL/HDL RATIO: 4
CHOLESTEROL: 187 mg/dL (ref 0–200)
HDL: 52.7 mg/dL (ref >39.00)
LDL Cholesterol: 110 mg/dL — ABNORMAL HIGH (ref 0–99)
NonHDL: 134.05
Triglycerides: 122 mg/dL (ref 0.0–149.0)
VLDL: 24.4 mg/dL (ref 0.0–40.0)

## 2017-10-01 LAB — BASIC METABOLIC PANEL
BUN: 12 mg/dL (ref 6–23)
CALCIUM: 9.4 mg/dL (ref 8.4–10.5)
CO2: 28 mEq/L (ref 19–32)
CREATININE: 0.61 mg/dL (ref 0.40–1.20)
Chloride: 103 mEq/L (ref 96–112)
GFR: 111.37 mL/min (ref >60.00)
Glucose, Bld: 106 mg/dL — ABNORMAL HIGH (ref 70–99)
POTASSIUM: 3.7 meq/L (ref 3.5–5.1)
Sodium: 140 mEq/L (ref 135–145)

## 2017-10-01 LAB — VITAMIN D 25 HYDROXY (VIT D DEFICIENCY, FRACTURES): VITD: 27.08 ng/mL — ABNORMAL LOW (ref 30.00–100.00)

## 2017-10-01 LAB — TSH: TSH: 1.32 u[IU]/mL (ref 0.35–4.50)

## 2017-10-01 NOTE — Progress Notes (Signed)
   Subjective:    Patient ID: Jennifer Willis, female    DOB: [birth year], 47 y.o.   MRN: [id number]  HPI CPE- UTD on pap, due for repeat mammo this month.  UTD on Tdap.  Due for flu shot.     Review of Systems Patient reports no vision/ hearing changes, adenopathy,fever, weight change,  persistant/recurrent hoarseness , swallowing issues, chest pain, palpitations, edema, persistant/recurrent cough, hemoptysis, dyspnea (rest/exertional/paroxysmal nocturnal), gastrointestinal bleeding (melena, rectal bleeding), abdominal pain, significant heartburn, bowel changes, GU symptoms (dysuria, hematuria, incontinence), Gyn symptoms (abnormal  bleeding, pain),  syncope, focal weakness, memory loss, numbness & tingling, skin/hair/nail changes, abnormal bruising or bleeding, anxiety, or depression.     Objective:   Physical Exam General Appearance:    Alert, cooperative, no distress, appears stated age  Head:    Normocephalic, without obvious abnormality, atraumatic  Eyes:    PERRL, conjunctiva/corneas clear, EOM's intact, fundi    benign, both eyes  Ears:    Normal TM's and external ear canals, both ears  Nose:   Nares normal, septum midline, mucosa normal, no drainage    or sinus tenderness  Throat:   Lips, mucosa, and tongue normal; teeth and gums normal  Neck:   Supple, symmetrical, trachea midline, no adenopathy;    Thyroid: no enlargement/tenderness/nodules  Back:     Symmetric, no curvature, ROM normal, no CVA tenderness  Lungs:     Clear to auscultation bilaterally, respirations unlabored  Chest Wall:    No tenderness or deformity   Heart:    Regular rate and rhythm, S1 and S2 normal, no murmur, rub   or gallop  Breast Exam:    Deferred to mammo  Abdomen:     Soft, non-tender, bowel sounds active all four quadrants,    no masses, no organomegaly  Genitalia:    Deferred  Rectal:    Extremities:   Extremities normal, atraumatic, no cyanosis or edema  Pulses:   2+ and symmetric all extremities   Skin:   Skin color, texture, turgor normal, no rashes or lesions  Lymph nodes:   Cervical, supraclavicular, and axillary nodes normal  Neurologic:   CNII-XII intact, normal strength, sensation and reflexes    throughout          Assessment & Plan:

## 2017-10-01 NOTE — Addendum Note (Signed)
Addended by: Midge Minium on: 10/01/2017 02:12 PM   Modules accepted: Orders

## 2017-10-01 NOTE — Assessment & Plan Note (Signed)
Pt has hx of this.  Check labs and replete prn. 

## 2017-10-01 NOTE — Patient Instructions (Addendum)
Follow up in 1 year or as needed We'll notify you of your lab results and make any changes if needed Continue to work on healthy diet and regular exercise- you look great! I ordered your mammogram- please schedule at your convenience Call with any questions or concerns Happy Valentine's Day!!!

## 2017-10-01 NOTE — Assessment & Plan Note (Signed)
Pt's PE WNL.  UTD on pap.  Due for mammo later this month.  Check labs.  Anticipatory guidance provided.

## 2017-10-02 ENCOUNTER — Other Ambulatory Visit: Payer: Self-pay | Admitting: General Practice

## 2017-10-02 MED ORDER — VITAMIN D (ERGOCALCIFEROL) 1.25 MG (50000 UNIT) PO CAPS: 50000.0000 [IU] | ORAL_CAPSULE | ORAL | 0 refills | Status: DC

## 2019-10-02 ENCOUNTER — Encounter: Payer: Self-pay | Admitting: Family Medicine

## 2019-10-02 ENCOUNTER — Other Ambulatory Visit: Payer: Self-pay

## 2019-10-02 ENCOUNTER — Ambulatory Visit (INDEPENDENT_AMBULATORY_CARE_PROVIDER_SITE_OTHER): Payer: 59 | Admitting: Family Medicine

## 2019-10-02 VITALS — BP 121/80 | HR 72 | Temp 98.5°F | Resp 16 | Ht 63.0 in | Wt 140.1 lb

## 2019-10-02 DIAGNOSIS — E559 Vitamin D deficiency, unspecified: Secondary | ICD-10-CM

## 2019-10-02 DIAGNOSIS — Z1231 Encounter for screening mammogram for malignant neoplasm of breast: Secondary | ICD-10-CM

## 2019-10-02 DIAGNOSIS — Z0000 Encounter for general adult medical examination without abnormal findings: Secondary | ICD-10-CM

## 2019-10-02 DIAGNOSIS — E785 Hyperlipidemia, unspecified: Secondary | ICD-10-CM | POA: Diagnosis not present

## 2019-10-02 LAB — CBC WITH DIFFERENTIAL/PLATELET
Basophils Absolute: 0.1 10*3/uL (ref 0.0–0.1)
Basophils Relative: 0.8 % (ref 0.0–3.0)
Eosinophils Absolute: 0.1 10*3/uL (ref 0.0–0.7)
Eosinophils Relative: 1.8 % (ref 0.0–5.0)
HCT: 34 % — ABNORMAL LOW (ref 36.0–46.0)
Hemoglobin: 10.4 g/dL — ABNORMAL LOW (ref 12.0–15.0)
Lymphocytes Relative: 29.3 % (ref 12.0–46.0)
Lymphs Abs: 2.1 10*3/uL (ref 0.7–4.0)
MCHC: 30.5 g/dL (ref 30.0–36.0)
MCV: 64 fl — ABNORMAL LOW (ref 78.0–100.0)
Monocytes Absolute: 0.7 10*3/uL (ref 0.1–1.0)
Monocytes Relative: 9.6 % (ref 3.0–12.0)
Neutro Abs: 4.1 10*3/uL (ref 1.4–7.7)
Neutrophils Relative %: 58.5 % (ref 43.0–77.0)
Platelets: 369 10*3/uL (ref 150.0–400.0)
RBC: 5.31 Mil/uL — ABNORMAL HIGH (ref 3.87–5.11)
RDW: 26.3 % — ABNORMAL HIGH (ref 11.5–15.5)
WBC: 7 10*3/uL (ref 4.0–10.5)

## 2019-10-02 LAB — LIPID PANEL
Cholesterol: 181 mg/dL (ref 0–200)
HDL: 59.2 mg/dL (ref >39.00)
LDL Cholesterol: 95 mg/dL (ref 0–99)
NonHDL: 122
Total CHOL/HDL Ratio: 3
Triglycerides: 136 mg/dL (ref 0.0–149.0)
VLDL: 27.2 mg/dL (ref 0.0–40.0)

## 2019-10-02 LAB — HEPATIC FUNCTION PANEL
ALT: 14 U/L (ref 0–35)
AST: 18 U/L (ref 0–37)
Albumin: 4.3 g/dL (ref 3.5–5.2)
Alkaline Phosphatase: 49 U/L (ref 39–117)
Bilirubin, Direct: 0.1 mg/dL (ref 0.0–0.3)
Total Bilirubin: 0.5 mg/dL (ref 0.2–1.2)
Total Protein: 7.7 g/dL (ref 6.0–8.3)

## 2019-10-02 LAB — BASIC METABOLIC PANEL
BUN: 17 mg/dL (ref 6–23)
CO2: 27 mEq/L (ref 19–32)
Calcium: 9.7 mg/dL (ref 8.4–10.5)
Chloride: 104 mEq/L (ref 96–112)
Creatinine, Ser: 0.63 mg/dL (ref 0.40–1.20)
GFR: 100.11 mL/min (ref >60.00)
Glucose, Bld: 86 mg/dL (ref 70–99)
Potassium: 4.7 mEq/L (ref 3.5–5.1)
Sodium: 138 mEq/L (ref 135–145)

## 2019-10-02 LAB — TSH: TSH: 1.68 u[IU]/mL (ref 0.35–4.50)

## 2019-10-02 LAB — VITAMIN D 25 HYDROXY (VIT D DEFICIENCY, FRACTURES): VITD: 66.11 ng/mL (ref 30.00–100.00)

## 2019-10-02 NOTE — Assessment & Plan Note (Signed)
Pt's PE WNL.  Due for pap- she declined today.  Due for mammo- ordered.  Declined flu shot.  Check labs.  Anticipatory guidance provided.

## 2019-10-02 NOTE — Patient Instructions (Signed)
Follow up in 1 year or as needed We'll notify you of your lab results and make any changes if needed Continue to work on healthy diet and regular exercise- you can do it! They should call you to schedule your mammogram appt Call with any questions or concerns Stay Safe!  Stay Healthy!

## 2019-10-02 NOTE — Progress Notes (Signed)
   Subjective:    Patient ID: Jennifer Willis, female    DOB: [birth year], 49 y.o.   MRN: [id number]  HPI CPE- UTD on Tdap.  Declines flu.  Due for pap and mammo.  Pt declines pap.   Review of Systems Patient reports no vision/ hearing changes, adenopathy,fever, weight change,  persistant/recurrent hoarseness , swallowing issues, chest pain, palpitations, edema, persistant/recurrent cough, hemoptysis, dyspnea (rest/exertional/paroxysmal nocturnal), gastrointestinal bleeding (melena, rectal bleeding), abdominal pain, significant heartburn, bowel changes, GU symptoms (dysuria, hematuria, incontinence), Gyn symptoms (abnormal  bleeding, pain),  syncope, focal weakness, memory loss, numbness & tingling, skin/hair/nail changes, abnormal bruising or bleeding, anxiety, or depression.   This visit occurred during the SARS-CoV-2 public health emergency.  Safety protocols were in place, including screening questions prior to the visit, additional usage of staff PPE, and extensive cleaning of exam room while observing appropriate contact time as indicated for disinfecting solutions.       Objective:   Physical Exam General Appearance:    Alert, cooperative, no distress, appears stated age  Head:    Normocephalic, without obvious abnormality, atraumatic  Eyes:    PERRL, conjunctiva/corneas clear, EOM's intact, fundi    benign, both eyes  Ears:    Normal TM's and external ear canals, both ears  Nose:   Deferred due to COVID  Throat:   Neck:   Supple, symmetrical, trachea midline, no adenopathy;    Thyroid: no enlargement/tenderness/nodules  Back:     Symmetric, no curvature, ROM normal, no CVA tenderness  Lungs:     Clear to auscultation bilaterally, respirations unlabored  Chest Wall:    No tenderness or deformity   Heart:    Regular rate and rhythm, S1 and S2 normal, no murmur, rub   or gallop  Breast Exam:    Deferred to mammo  Abdomen:     Soft, non-tender, bowel sounds active all four quadrants,     no masses, no organomegaly  Genitalia:    Deferred at pt's request  Rectal:    Extremities:   Extremities normal, atraumatic, no cyanosis or edema  Pulses:   2+ and symmetric all extremities  Skin:   Skin color, texture, turgor normal, no rashes or lesions  Lymph nodes:   Cervical, supraclavicular, and axillary nodes normal  Neurologic:   CNII-XII intact, normal strength, sensation and reflexes    throughout          Assessment & Plan:

## 2019-10-02 NOTE — Assessment & Plan Note (Signed)
Pt has hx of this.  Check labs and replete prn. 

## 2019-10-03 ENCOUNTER — Other Ambulatory Visit: Payer: Self-pay | Admitting: General Practice

## 2019-10-03 DIAGNOSIS — D649 Anemia, unspecified: Secondary | ICD-10-CM

## 2019-10-03 MED ORDER — FERROUS SULFATE 325 (65 FE) MG PO TABS: 325.0000 mg | ORAL_TABLET | Freq: Every day | ORAL | 6 refills | Status: DC

## 2019-11-17 ENCOUNTER — Ambulatory Visit (INDEPENDENT_AMBULATORY_CARE_PROVIDER_SITE_OTHER): Payer: 59 | Admitting: Emergency Medicine

## 2019-11-17 ENCOUNTER — Other Ambulatory Visit: Payer: Self-pay

## 2019-11-17 DIAGNOSIS — D649 Anemia, unspecified: Secondary | ICD-10-CM | POA: Diagnosis not present

## 2019-11-18 LAB — CBC WITH DIFFERENTIAL/PLATELET
Basophils Absolute: 0.1 10*3/uL (ref 0.0–0.1)
Basophils Relative: 1.7 % (ref 0.0–3.0)
Eosinophils Absolute: 0.2 10*3/uL (ref 0.0–0.7)
Eosinophils Relative: 1.9 % (ref 0.0–5.0)
HCT: 38.9 % (ref 36.0–46.0)
Hemoglobin: 12.6 g/dL (ref 12.0–15.0)
Lymphocytes Relative: 30.8 % (ref 12.0–46.0)
Lymphs Abs: 2.6 10*3/uL (ref 0.7–4.0)
MCHC: 32.4 g/dL (ref 30.0–36.0)
MCV: 73.3 fl — ABNORMAL LOW (ref 78.0–100.0)
Monocytes Absolute: 1.1 10*3/uL — ABNORMAL HIGH (ref 0.1–1.0)
Monocytes Relative: 12.7 % — ABNORMAL HIGH (ref 3.0–12.0)
Neutro Abs: 4.5 10*3/uL (ref 1.4–7.7)
Neutrophils Relative %: 52.9 % (ref 43.0–77.0)
Platelets: 306 10*3/uL (ref 150.0–400.0)
RBC: 5.31 Mil/uL — ABNORMAL HIGH (ref 3.87–5.11)
RDW: 28 % — ABNORMAL HIGH (ref 11.5–15.5)
WBC: 8.4 10*3/uL (ref 4.0–10.5)

## 2020-10-04 ENCOUNTER — Encounter: Payer: 59 | Admitting: Family Medicine

## 2020-11-04 ENCOUNTER — Ambulatory Visit (INDEPENDENT_AMBULATORY_CARE_PROVIDER_SITE_OTHER): Payer: 59 | Admitting: Family Medicine

## 2020-11-04 ENCOUNTER — Other Ambulatory Visit: Payer: Self-pay

## 2020-11-04 ENCOUNTER — Encounter: Payer: Self-pay | Admitting: Family Medicine

## 2020-11-04 VITALS — BP 122/68 | HR 74 | Temp 98.1°F | Resp 18 | Ht 62.0 in | Wt 144.0 lb

## 2020-11-04 DIAGNOSIS — Z114 Encounter for screening for human immunodeficiency virus [HIV]: Secondary | ICD-10-CM

## 2020-11-04 DIAGNOSIS — Z1159 Encounter for screening for other viral diseases: Secondary | ICD-10-CM

## 2020-11-04 DIAGNOSIS — Z1211 Encounter for screening for malignant neoplasm of colon: Secondary | ICD-10-CM

## 2020-11-04 DIAGNOSIS — Z0000 Encounter for general adult medical examination without abnormal findings: Secondary | ICD-10-CM | POA: Diagnosis not present

## 2020-11-04 DIAGNOSIS — E663 Overweight: Secondary | ICD-10-CM | POA: Diagnosis not present

## 2020-11-04 DIAGNOSIS — Z1231 Encounter for screening mammogram for malignant neoplasm of breast: Secondary | ICD-10-CM

## 2020-11-04 DIAGNOSIS — E559 Vitamin D deficiency, unspecified: Secondary | ICD-10-CM

## 2020-11-04 LAB — BASIC METABOLIC PANEL
BUN: 16 mg/dL (ref 6–23)
CO2: 28 mEq/L (ref 19–32)
Calcium: 9.9 mg/dL (ref 8.4–10.5)
Chloride: 103 mEq/L (ref 96–112)
Creatinine, Ser: 0.68 mg/dL (ref 0.40–1.20)
GFR: 101.23 mL/min (ref >60.00)
Glucose, Bld: 88 mg/dL (ref 70–99)
Potassium: 3.9 mEq/L (ref 3.5–5.1)
Sodium: 139 mEq/L (ref 135–145)

## 2020-11-04 LAB — CBC WITH DIFFERENTIAL/PLATELET
Basophils Absolute: 0 10*3/uL (ref 0.0–0.1)
Basophils Relative: 0.7 % (ref 0.0–3.0)
Eosinophils Absolute: 0.1 10*3/uL (ref 0.0–0.7)
Eosinophils Relative: 1.4 % (ref 0.0–5.0)
HCT: 35.4 % — ABNORMAL LOW (ref 36.0–46.0)
Hemoglobin: 11.7 g/dL — ABNORMAL LOW (ref 12.0–15.0)
Lymphocytes Relative: 29.8 % (ref 12.0–46.0)
Lymphs Abs: 1.9 10*3/uL (ref 0.7–4.0)
MCHC: 33 g/dL (ref 30.0–36.0)
MCV: 70.5 fl — ABNORMAL LOW (ref 78.0–100.0)
Monocytes Absolute: 0.5 10*3/uL (ref 0.1–1.0)
Monocytes Relative: 8.8 % (ref 3.0–12.0)
Neutro Abs: 3.7 10*3/uL (ref 1.4–7.7)
Neutrophils Relative %: 59.3 % (ref 43.0–77.0)
Platelets: 344 10*3/uL (ref 150.0–400.0)
RBC: 5.02 Mil/uL (ref 3.87–5.11)
RDW: 20 % — ABNORMAL HIGH (ref 11.5–15.5)
WBC: 6.2 10*3/uL (ref 4.0–10.5)

## 2020-11-04 LAB — HEPATIC FUNCTION PANEL
ALT: 29 U/L (ref 0–35)
AST: 24 U/L (ref 0–37)
Albumin: 4.4 g/dL (ref 3.5–5.2)
Alkaline Phosphatase: 51 U/L (ref 39–117)
Bilirubin, Direct: 0.1 mg/dL (ref 0.0–0.3)
Total Bilirubin: 0.6 mg/dL (ref 0.2–1.2)
Total Protein: 7.9 g/dL (ref 6.0–8.3)

## 2020-11-04 LAB — TSH: TSH: 1.42 u[IU]/mL (ref 0.35–4.50)

## 2020-11-04 LAB — LIPID PANEL
Cholesterol: 185 mg/dL (ref 0–200)
HDL: 54.4 mg/dL (ref >39.00)
LDL Cholesterol: 112 mg/dL — ABNORMAL HIGH (ref 0–99)
NonHDL: 130.38
Total CHOL/HDL Ratio: 3
Triglycerides: 90 mg/dL (ref 0.0–149.0)
VLDL: 18 mg/dL (ref 0.0–40.0)

## 2020-11-04 LAB — VITAMIN D 25 HYDROXY (VIT D DEFICIENCY, FRACTURES): VITD: 64.31 ng/mL (ref 30.00–100.00)

## 2020-11-04 NOTE — Progress Notes (Signed)
   Subjective:    Patient ID: Jennifer Willis, female    DOB: Jul 21, 1971, 50 y.o.   MRN: [id number]  HPI CPE- UTD on Tdap, COVID.  Due for colonoscopy, mammo  Reviewed past medical, surgical, family and social histories.   Health Maintenance  Topic Date Due  . Hepatitis C Screening  Never done  . COVID-19 Vaccine (1) Never done  . HIV Screening  Never done  . COLONOSCOPY (Pts 45-75yrs Insurance coverage will need to be confirmed)  Never done  . INFLUENZA VACCINE  12/21/2020 (Originally 03/21/2020)  . MAMMOGRAM  05/16/2021 (Originally 10/09/2017)  . PAP SMEAR-Modifier  05/16/2021 (Originally 09/30/2019)  . TETANUS/TDAP  09/29/2026  . HPV VACCINES  Aged Out      Review of Systems Patient reports no vision/ hearing changes, adenopathy,fever, weight change,  persistant/recurrent hoarseness , swallowing issues, chest pain, palpitations, edema, persistant/recurrent cough, hemoptysis, dyspnea (rest/exertional/paroxysmal nocturnal), gastrointestinal bleeding (melena, rectal bleeding), abdominal pain, significant heartburn, bowel changes, GU symptoms (dysuria, hematuria, incontinence), Gyn symptoms (abnormal  bleeding, pain),  syncope, focal weakness, memory loss, numbness & tingling, skin/hair/nail changes, abnormal bruising or bleeding, anxiety, or depression.   This visit occurred during the SARS-CoV-2 public health emergency.  Safety protocols were in place, including screening questions prior to the visit, additional usage of staff PPE, and extensive cleaning of exam room while observing appropriate contact time as indicated for disinfecting solutions.       Objective:   Physical Exam General Appearance:    Alert, cooperative, no distress, appears stated age  Head:    Normocephalic, without obvious abnormality, atraumatic  Eyes:    PERRL, conjunctiva/corneas clear, EOM's intact, fundi    benign, both eyes  Ears:    Normal TM's and external ear canals, both ears  Nose:   Deferred due to COVID   Throat:   Neck:   Supple, symmetrical, trachea midline, no adenopathy;    Thyroid: no enlargement/tenderness/nodules  Back:     Symmetric, no curvature, ROM normal, no CVA tenderness  Lungs:     Clear to auscultation bilaterally, respirations unlabored  Chest Wall:    No tenderness or deformity   Heart:    Regular rate and rhythm, S1 and S2 normal, no murmur, rub   or gallop  Breast Exam:    Deferred to mammo  Abdomen:     Soft, non-tender, bowel sounds active all four quadrants,    no masses, no organomegaly  Genitalia:    Deferred  Rectal:    Extremities:   Extremities normal, atraumatic, no cyanosis or edema  Pulses:   2+ and symmetric all extremities  Skin:   Skin color, texture, turgor normal, no rashes or lesions  Lymph nodes:   Cervical, supraclavicular, and axillary nodes normal  Neurologic:   CNII-XII intact, normal strength, sensation and reflexes    throughout          Assessment & Plan:

## 2020-11-04 NOTE — Patient Instructions (Signed)
Follow up in 1 year or as needed We'll notify you of your lab results and make any changes if needed We'll call you with your colonoscopy consultation and mammogram appts Keep up the good work!  You look great! Call with any questions or concerns Stay Safe!  Stay Healthy! Happy Spring!!

## 2020-11-04 NOTE — Assessment & Plan Note (Signed)
Pt's PE WNL.  UTD on pap- due next year.  Referral placed for mammo and GI.  UTD on Tdap, COVID.  Check labs.  Anticipatory guidance provided.

## 2020-11-04 NOTE — Assessment & Plan Note (Signed)
Pt has hx of low Vit D.  Check labs and replete prn.

## 2020-11-05 LAB — HIV ANTIBODY (ROUTINE TESTING W REFLEX): HIV 1&2 Ab, 4th Generation: NONREACTIVE

## 2020-11-05 LAB — HEPATITIS C ANTIBODY
Hepatitis C Ab: NONREACTIVE
SIGNAL TO CUT-OFF: 0.01 (ref <1.00)

## 2020-12-08 ENCOUNTER — Encounter: Payer: Self-pay | Admitting: Gastroenterology

## 2021-01-20 ENCOUNTER — Ambulatory Visit
Admission: RE | Admit: 2021-01-20 | Discharge: 2021-01-20 | Disposition: A | Discharge status: Home / Self Care | Payer: 59 | Source: Ambulatory Visit | Attending: Family Medicine | Admitting: Family Medicine

## 2021-01-20 ENCOUNTER — Other Ambulatory Visit: Payer: Self-pay

## 2021-01-20 DIAGNOSIS — Z1231 Encounter for screening mammogram for malignant neoplasm of breast: Secondary | ICD-10-CM

## 2021-02-14 ENCOUNTER — Other Ambulatory Visit: Payer: Self-pay

## 2021-02-14 ENCOUNTER — Ambulatory Visit (AMBULATORY_SURGERY_CENTER): Payer: 59 | Admitting: *Deleted

## 2021-02-14 VITALS — Ht 62.0 in | Wt 146.0 lb

## 2021-02-14 DIAGNOSIS — Z1211 Encounter for screening for malignant neoplasm of colon: Secondary | ICD-10-CM

## 2021-02-14 MED ORDER — NA SULFATE-K SULFATE-MG SULF 17.5-3.13-1.6 GM/177ML PO SOLN: ORAL | 0 refills | Status: DC

## 2021-02-14 NOTE — Progress Notes (Signed)
Patient is here in-person for PV. Patient denies any allergies to eggs or soy. Patient denies any problems with anesthesia/sedation. PONV. Patient denies any oxygen use at home. Patient denies taking any diet/weight loss medications or blood thinners. Patient is not being treated for MRSA or C-diff. Patient is aware of our care-partner policy and GNFAO-13 safety protocol. EMMI education assigned to the patient for the procedure, sent to [place].   Patient is COVID-19 vaccinated, per patient.

## 2021-02-16 ENCOUNTER — Encounter: Payer: Self-pay | Admitting: *Deleted

## 2021-02-28 ENCOUNTER — Encounter: Payer: Self-pay | Admitting: Gastroenterology

## 2021-02-28 ENCOUNTER — Ambulatory Visit (AMBULATORY_SURGERY_CENTER): Payer: 59 | Admitting: Gastroenterology

## 2021-02-28 ENCOUNTER — Encounter: Payer: 59 | Admitting: Gastroenterology

## 2021-02-28 ENCOUNTER — Other Ambulatory Visit: Payer: Self-pay

## 2021-02-28 VITALS — BP 131/78 | HR 65 | Temp 98.0°F | Resp 12 | Ht 62.0 in | Wt 146.0 lb

## 2021-02-28 DIAGNOSIS — Z1211 Encounter for screening for malignant neoplasm of colon: Secondary | ICD-10-CM

## 2021-02-28 DIAGNOSIS — D124 Benign neoplasm of descending colon: Secondary | ICD-10-CM | POA: Diagnosis not present

## 2021-02-28 MED ORDER — SODIUM CHLORIDE 0.9 % IV SOLN: 500.0000 mL | INTRAVENOUS | Status: DC

## 2021-02-28 NOTE — Progress Notes (Signed)
To pacu, Vss. Report to [email or web address] 

## 2021-02-28 NOTE — Patient Instructions (Signed)
YOU HAD AN ENDOSCOPIC PROCEDURE TODAY AT THE [facility]:   Refer to the procedure report that was given to you for any specific questions about what was found during the examination.  If the procedure report does not answer your questions, please call your gastroenterologist to clarify.  If you requested that your care partner not be given the details of your procedure findings, then the procedure report has been included in a sealed envelope for you to review at your convenience later.  YOU SHOULD EXPECT: Some feelings of bloating in the abdomen. Passage of more gas than usual.  Walking can help get rid of the air that was put into your GI tract during the procedure and reduce the bloating. If you had a lower endoscopy (such as a colonoscopy or flexible sigmoidoscopy) you may notice spotting of blood in your stool or on the toilet paper. If you underwent a bowel prep for your procedure, you may not have a normal bowel movement for a few days.  Please Note:  You might notice some irritation and congestion in your nose or some drainage.  This is from the oxygen used during your procedure.  There is no need for concern and it should clear up in a day or so.  SYMPTOMS TO REPORT IMMEDIATELY:   Following lower endoscopy (colonoscopy or flexible sigmoidoscopy):  Excessive amounts of blood in the stool  Significant tenderness or worsening of abdominal pains  Swelling of the abdomen that is new, acute  Fever of 100F or higher   Following upper endoscopy (EGD)  Vomiting of blood or coffee ground material  New chest pain or pain under the shoulder blades  Painful or persistently difficult swallowing  New shortness of breath  Fever of 100F or higher  Black, tarry-looking stools  For urgent or emergent issues, a gastroenterologist can be reached at any hour by calling [phone]. Do not use MyChart messaging for urgent concerns.    DIET:  We do recommend a small meal at first, but  then you may proceed to your regular diet.  Drink plenty of fluids but you should avoid alcoholic beverages for 24 hours.  ACTIVITY:  You should plan to take it easy for the rest of today and you should NOT DRIVE or use heavy machinery until tomorrow (because of the sedation medicines used during the test).    FOLLOW UP: Our staff will call the number listed on your records 48-72 hours following your procedure to check on you and address any questions or concerns that you may have regarding the information given to you following your procedure. If we do not reach you, we will leave a message.  We will attempt to reach you two times.  During this call, we will ask if you have developed any symptoms of COVID 19. If you develop any symptoms (ie: fever, flu-like symptoms, shortness of breath, cough etc.) before then, please call [phone].  If you test positive for Covid 19 in the 2 weeks post procedure, please call and report this information to us.    If any biopsies were taken you will be contacted by phone or by letter within the next 1-3 weeks.  Please call us at [phone] if you have not heard about the biopsies in 3 weeks.    SIGNATURES/CONFIDENTIALITY: You and/or your care partner have signed paperwork which will be entered into your electronic medical record.  These signatures attest to the fact that that the information above on   your After Visit Summary has been reviewed and is understood.  Full responsibility of the confidentiality of this discharge information lies with you and/or your care-partner. 

## 2021-02-28 NOTE — Progress Notes (Signed)
Called to room to assist during endoscopic procedure.  Patient ID and intended procedure confirmed with present staff. Received instructions for my participation in the procedure from the performing physician.  

## 2021-02-28 NOTE — Op Note (Signed)
[facility] Patient Name: Jennifer Willis Procedure Date: 02/28/2021 9:08 AM MRN: [id number] Endoscopist: Mallie Mussel L. Loletha Carrow , MD Age: 50 Referring MD:  Date of Birth: 17-Mar-1971 Gender: Female Account #: [id number] Procedure:                Colonoscopy Indications:              Screening for colorectal malignant neoplasm, This                            is the patient's first colonoscopy Medicines:                Monitored Anesthesia Care Procedure:                Pre-Anesthesia Assessment:                           - Prior to the procedure, a History and Physical                            was performed, and patient medications and                            allergies were reviewed. The patient's tolerance of                            previous anesthesia was also reviewed. The risks                            and benefits of the procedure and the sedation                            options and risks were discussed with the patient.                            All questions were answered, and informed consent                            was obtained. Prior Anticoagulants: The patient has                            taken no previous anticoagulant or antiplatelet                            agents. ASA Grade Assessment: I - A normal, healthy                            patient. After reviewing the risks and benefits,                            the patient was deemed in satisfactory condition to                            undergo the procedure.  After obtaining informed consent, the colonoscope                            was passed under direct vision. Throughout the                            procedure, the patient's blood pressure, pulse, and                            oxygen saturations were monitored continuously. The                            Olympus PFC-[id number] (#[id number]) Colonoscope was                            introduced through the anus and advanced  to the the                            cecum, identified by appendiceal orifice and                            ileocecal valve. The colonoscopy was performed                            without difficulty. The patient tolerated the                            procedure well. The quality of the bowel                            preparation was excellent. The ileocecal valve,                            appendiceal orifice, and rectum were photographed. Scope In: 9:14:51 AM Scope Out: 9:24:24 AM Scope Withdrawal Time: 0 hours 7 minutes 52 seconds  Total Procedure Duration: 0 hours 9 minutes 33 seconds  Findings:                 The perianal and digital rectal examinations were                            normal.                           Melanosis was found in the entire colon.                           A few small-mouthed diverticula were found in the                            left colon.                           A 5 mm polyp was found in the proximal descending  colon. The polyp was sessile. The polyp was removed                            with a cold snare. Resection and retrieval were                            complete.                           The exam was otherwise without abnormality on                            direct and retroflexion views. Complications:            No immediate complications. Estimated Blood Loss:     Estimated blood loss was minimal. Impression:               - Melanosis in the colon.                           - Diverticulosis in the left colon.                           - One 5 mm polyp in the proximal descending colon,                            removed with a cold snare. Resected and retrieved.                           - The examination was otherwise normal on direct                            and retroflexion views. Recommendation:           - Patient has a contact number available for                            emergencies. The  signs and symptoms of potential                            delayed complications were discussed with the                            patient. Return to normal activities tomorrow.                            Written discharge instructions were provided to the                            patient.                           - Resume previous diet.                           - Continue present medications.                           -  Await pathology results.                           - Repeat colonoscopy is recommended for                            surveillance. The colonoscopy date will be                            determined after pathology results from today's                            exam become available for review. Aubreana Cornacchia L. Loletha Carrow, MD 02/28/2021 9:28:59 AM This report has been signed electronically.

## 2021-02-28 NOTE — Progress Notes (Signed)
Cw v/s I have reviewed the patient's medical history in detail and updated the computerized patient record.

## 2021-03-02 ENCOUNTER — Telehealth: Payer: Self-pay

## 2021-03-02 NOTE — Telephone Encounter (Signed)
  Follow up Call-  Call back number 02/28/2021  Post procedure Call Back phone  # [phone]  Permission to leave phone message Yes  Some recent data might be hidden     Patient questions:  Do you have a fever, pain , or abdominal swelling? No. Pain Score  0 *  Have you tolerated food without any problems? Yes.    Have you been able to return to your normal activities? Yes.    Do you have any questions about your discharge instructions: Diet   No. Medications  No. Follow up visit  No.  Do you have questions or concerns about your Care? No.  Actions: * If pain score is 4 or above: No action needed, pain <4.  Have you developed a fever since your procedure? No   2.   Have you had an respiratory symptoms (SOB or cough) since your procedure? No   3.   Have you tested positive for COVID 19 since your procedure no   4.   Have you had any family members/close contacts diagnosed with the COVID 19 since your procedure?  No    If yes to any of these questions please route to Joylene John, RN and Joella Prince, RN

## 2021-03-02 NOTE — Telephone Encounter (Signed)
NO ANSWER, MESSAGE LEFT FOR PATIENT. 

## 2021-03-08 ENCOUNTER — Encounter: Payer: Self-pay | Admitting: Gastroenterology

## 2021-05-04 ENCOUNTER — Encounter: Payer: 59 | Admitting: Gastroenterology

## 2021-10-07 ENCOUNTER — Encounter: Payer: Self-pay | Admitting: Family

## 2021-10-07 ENCOUNTER — Telehealth (INDEPENDENT_AMBULATORY_CARE_PROVIDER_SITE_OTHER): Payer: Managed Care, Other (non HMO) | Admitting: Family

## 2021-10-07 VITALS — Temp 97.9°F

## 2021-10-07 DIAGNOSIS — J029 Acute pharyngitis, unspecified: Secondary | ICD-10-CM | POA: Diagnosis not present

## 2021-10-07 MED ORDER — FLUTICASONE PROPIONATE 50 MCG/ACT NA SUSP: 2.0000 | Freq: Every day | NASAL | 0 refills | Status: DC

## 2021-10-07 MED ORDER — AZITHROMYCIN 250 MG PO TABS: ORAL_TABLET | ORAL | 0 refills | Status: DC

## 2021-10-07 NOTE — Progress Notes (Signed)
°  Jennifer Willis is a 51 y.o. female with the following history as recorded in [employer]:  Patient Active Problem List   Diagnosis Date Noted   Vitamin D deficiency 10/01/2017   Physical exam 08/28/2014   Screening for malignant neoplasm of cervix 06/17/2012    Current Outpatient Medications  Medication Sig Dispense Refill   Cholecalciferol (VITAMIN D3 PO) Take by mouth.     Digestive Enzymes (DIGESTIVE ENZYME PO) Take by mouth.     ferrous sulfate 325 (65 FE) MG tablet Take 1 tablet (325 mg total) by mouth daily with breakfast. 30 tablet 6   Multiple Vitamins-Minerals (MULTIVITAMIN GUMMIES ADULT PO) Take by mouth.     Na Sulfate-K Sulfate-Mg Sulf 17.5-3.13-1.6 GM/177ML SOLN Suprep (no substitutions)-TAKE AS DIRECTED. 354 mL 0   OVER THE COUNTER MEDICATION Pt taking shaklee supplements Iron Plus C Complex, Vitamin D, Nutriferon, and Zinc     No current facility-administered medications for this visit.    Allergies: Shrimp [shellfish allergy]  Past Medical History:  Diagnosis Date   Chicken pox    Post-operative nausea and vomiting     Past Surgical History:  Procedure Laterality Date   HERNIA REPAIR     TUBAL LIGATION      Family History  Problem Relation Age of Onset   Arthritis Mother    Hypertension Mother    Colon cancer Neg Hx    Colon polyps Neg Hx    Esophageal cancer Neg Hx    Rectal cancer Neg Hx    Stomach cancer Neg Hx     Social History   Tobacco Use   Smoking status: Never   Smokeless tobacco: Never  Substance Use Topics   Alcohol use: No    Subjective:    I connected with Jennifer Willis on 10/07/21 at 11:00 AM EST by a video enabled telemedicine application and verified that I am speaking with the correct person using two identifiers.   I discussed the limitations of evaluation and management by telemedicine and the availability of in person appointments. The patient expressed understanding and agreed to proceed. Provider in office/ patient is at  home; provider and patient are only 2 people on video call.   Notes that has had cough/ congestion for 2 weeks- does feel like she has been improving; however, in past few days, has developed sore throat/ "white spots" in back of her throat; no fever;      Objective:  Vitals:   10/07/21 1055  Temp: 97.9 F (36.6 C)  TempSrc: Oral    General: Well developed, well nourished, in no acute distress  Head: Normocephalic and atraumatic  Lungs: Respirations unlabored;  Neurologic: Alert and oriented; speech intact; face symmetrical;   Assessment:  1. Sore throat     Plan:  Unable to evaluate throat on virtual visit; will go ahead and treat with Z-pak; she understands to follow up in person with her PCP if symptoms persist.   No follow-ups on file.  No orders of the defined types were placed in this encounter.   Requested Prescriptions    No prescriptions requested or ordered in this encounter

## 2021-11-05 ENCOUNTER — Other Ambulatory Visit: Payer: Self-pay | Admitting: Family

## 2021-11-07 ENCOUNTER — Encounter: Payer: Self-pay | Admitting: Family Medicine

## 2021-11-07 ENCOUNTER — Ambulatory Visit (INDEPENDENT_AMBULATORY_CARE_PROVIDER_SITE_OTHER): Payer: Managed Care, Other (non HMO) | Admitting: Family Medicine

## 2021-11-07 VITALS — BP 118/82 | HR 73 | Temp 98.2°F | Resp 16 | Ht 62.0 in | Wt 150.4 lb

## 2021-11-07 DIAGNOSIS — Z0000 Encounter for general adult medical examination without abnormal findings: Secondary | ICD-10-CM

## 2021-11-07 DIAGNOSIS — R748 Abnormal levels of other serum enzymes: Secondary | ICD-10-CM

## 2021-11-07 DIAGNOSIS — E559 Vitamin D deficiency, unspecified: Secondary | ICD-10-CM | POA: Diagnosis not present

## 2021-11-07 DIAGNOSIS — E663 Overweight: Secondary | ICD-10-CM

## 2021-11-07 LAB — CBC WITH DIFFERENTIAL/PLATELET
Basophils Absolute: 0 10*3/uL (ref 0.0–0.1)
Basophils Relative: 0.3 % (ref 0.0–3.0)
Eosinophils Absolute: 0.1 10*3/uL (ref 0.0–0.7)
Eosinophils Relative: 1.6 % (ref 0.0–5.0)
HCT: 38.3 % (ref 36.0–46.0)
Hemoglobin: 12.6 g/dL (ref 12.0–15.0)
Lymphocytes Relative: 34.2 % (ref 12.0–46.0)
Lymphs Abs: 2.6 10*3/uL (ref 0.7–4.0)
MCHC: 33 g/dL (ref 30.0–36.0)
MCV: 74.1 fl — ABNORMAL LOW (ref 78.0–100.0)
Monocytes Absolute: 0.7 10*3/uL (ref 0.1–1.0)
Monocytes Relative: 9.8 % (ref 3.0–12.0)
Neutro Abs: 4 10*3/uL (ref 1.4–7.7)
Neutrophils Relative %: 54.1 % (ref 43.0–77.0)
Platelets: 284 10*3/uL (ref 150.0–400.0)
RBC: 5.18 Mil/uL — ABNORMAL HIGH (ref 3.87–5.11)
RDW: 20.5 % — ABNORMAL HIGH (ref 11.5–15.5)
WBC: 7.5 10*3/uL (ref 4.0–10.5)

## 2021-11-07 LAB — LIPID PANEL
Cholesterol: 186 mg/dL (ref 0–200)
HDL: 68.5 mg/dL (ref >39.00)
LDL Cholesterol: 102 mg/dL — ABNORMAL HIGH (ref 0–99)
NonHDL: 117.41
Total CHOL/HDL Ratio: 3
Triglycerides: 76 mg/dL (ref 0.0–149.0)
VLDL: 15.2 mg/dL (ref 0.0–40.0)

## 2021-11-07 LAB — HEPATIC FUNCTION PANEL
ALT: 127 U/L — ABNORMAL HIGH (ref 0–35)
AST: 108 U/L — ABNORMAL HIGH (ref 0–37)
Albumin: 4.5 g/dL (ref 3.5–5.2)
Alkaline Phosphatase: 54 U/L (ref 39–117)
Bilirubin, Direct: 0.1 mg/dL (ref 0.0–0.3)
Total Bilirubin: 0.7 mg/dL (ref 0.2–1.2)
Total Protein: 7.8 g/dL (ref 6.0–8.3)

## 2021-11-07 LAB — TSH: TSH: 1.69 u[IU]/mL (ref 0.35–5.50)

## 2021-11-07 LAB — BASIC METABOLIC PANEL
BUN: 16 mg/dL (ref 6–23)
CO2: 25 mEq/L (ref 19–32)
Calcium: 9.3 mg/dL (ref 8.4–10.5)
Chloride: 103 mEq/L (ref 96–112)
Creatinine, Ser: 0.58 mg/dL (ref 0.40–1.20)
GFR: 104.44 mL/min (ref >60.00)
Glucose, Bld: 74 mg/dL (ref 70–99)
Potassium: 3.6 mEq/L (ref 3.5–5.1)
Sodium: 137 mEq/L (ref 135–145)

## 2021-11-07 LAB — VITAMIN D 25 HYDROXY (VIT D DEFICIENCY, FRACTURES): VITD: 63.83 ng/mL (ref 30.00–100.00)

## 2021-11-07 NOTE — Assessment & Plan Note (Signed)
Pt's PE WNL.  UTD on mammo, colonoscopy, Tdap.  Declines pap today- wants to wait until next visit.  Check labs.  Anticipatory guidance provided.  ?

## 2021-11-07 NOTE — Assessment & Plan Note (Signed)
Check labs and replete prn. 

## 2021-11-07 NOTE — Patient Instructions (Signed)
Follow up in 1 year for physical and pap ?We'll notify you of your lab results and make any changes if needed ?Continue to work on healthy diet and regular exercise- you can do it! ?Call with any questions or concerns ?Stay Safe!  Stay Healthy! ?Happy Spring!! ?

## 2021-11-07 NOTE — Progress Notes (Signed)
? ?  Subjective:  ? ? Patient ID: Jennifer Willis, female    DOB: July 17, 1971, 51 y.o.   MRN: [id number] ? ?HPI ?CPE- UTD on colonoscopy, mammo.  Due for pap- pt defers until next year.  No concerns today ? ?Health Maintenance  ?Topic Date Due  ? COVID-19 Vaccine (1) Never done  ? PAP SMEAR-Modifier  09/30/2019  ? Zoster Vaccines- Shingrix (1 of 2) Never done  ? MAMMOGRAM  01/21/2023  ? TETANUS/TDAP  09/29/2026  ? COLONOSCOPY (Pts 45-75yr Insurance coverage will need to be confirmed)  02/29/2028  ? Hepatitis C Screening  Completed  ? HIV Screening  Completed  ? HPV VACCINES  Aged Out  ? INFLUENZA VACCINE  Discontinued  ?  ? ? ?Review of Systems ?Patient reports no vision/ hearing changes, adenopathy,fever, weight change,  persistant/recurrent hoarseness , swallowing issues, chest pain, palpitations, edema, persistant/recurrent cough, hemoptysis, dyspnea (rest/exertional/paroxysmal nocturnal), gastrointestinal bleeding (melena, rectal bleeding), abdominal pain, significant heartburn, bowel changes, GU symptoms (dysuria, hematuria, incontinence), Gyn symptoms (abnormal  bleeding, pain),  syncope, focal weakness, memory loss, numbness & tingling, skin/hair/nail changes, abnormal bruising or bleeding, anxiety, or depression.  ? ?This visit occurred during the SARS-CoV-2 public health emergency.  Safety protocols were in place, including screening questions prior to the visit, additional usage of staff PPE, and extensive cleaning of exam room while observing appropriate contact time as indicated for disinfecting solutions.   ?   ?Objective:  ? Physical Exam ?General Appearance:    Alert, cooperative, no distress, appears stated age  ?Head:    Normocephalic, without obvious abnormality, atraumatic  ?Eyes:    PERRL, conjunctiva/corneas clear, EOM's intact, fundi  ?  benign, both eyes  ?Ears:    Normal TM's and external ear canals, both ears  ?Nose:   Deferred due to COVID  ?Throat:   ?Neck:   Supple, symmetrical, trachea  midline, no adenopathy;  ?  Thyroid: no enlargement/tenderness/nodules  ?Back:     Symmetric, no curvature, ROM normal, no CVA tenderness  ?Lungs:     Clear to auscultation bilaterally, respirations unlabored  ?Chest Wall:    No tenderness or deformity  ? Heart:    Regular rate and rhythm, S1 and S2 normal, no murmur, rub ?  or gallop  ?Breast Exam:    Deferred to mammo  ?Abdomen:     Soft, non-tender, bowel sounds active all four quadrants,  ?  no masses, no organomegaly  ?Genitalia:    Deferred at pt's request  ?Rectal:    ?Extremities:   Extremities normal, atraumatic, no cyanosis or edema  ?Pulses:   2+ and symmetric all extremities  ?Skin:   Skin color, texture, turgor normal, no rashes or lesions  ?Lymph nodes:   Cervical, supraclavicular, and axillary nodes normal  ?Neurologic:   CNII-XII intact, normal strength, sensation and reflexes  ?  throughout  ?  ? ? ? ?   ?Assessment & Plan:  ? ? ?

## 2021-11-08 NOTE — Addendum Note (Signed)
Addended by: Agnes Lawrence on: 11/08/2021 10:44 AM ? ? Modules accepted: Orders ? ?

## 2021-11-24 ENCOUNTER — Other Ambulatory Visit: Payer: Managed Care, Other (non HMO)

## 2021-11-24 DIAGNOSIS — R748 Abnormal levels of other serum enzymes: Secondary | ICD-10-CM

## 2021-11-24 LAB — HEPATIC FUNCTION PANEL
ALT: 82 U/L — ABNORMAL HIGH (ref 0–35)
AST: 58 U/L — ABNORMAL HIGH (ref 0–37)
Albumin: 4.4 g/dL (ref 3.5–5.2)
Alkaline Phosphatase: 55 U/L (ref 39–117)
Bilirubin, Direct: 0.1 mg/dL (ref 0.0–0.3)
Total Bilirubin: 0.5 mg/dL (ref 0.2–1.2)
Total Protein: 7.4 g/dL (ref 6.0–8.3)

## 2022-03-14 ENCOUNTER — Encounter: Payer: Self-pay | Admitting: Family Medicine

## 2022-03-14 ENCOUNTER — Ambulatory Visit (INDEPENDENT_AMBULATORY_CARE_PROVIDER_SITE_OTHER): Payer: Commercial Managed Care - HMO | Admitting: Family Medicine

## 2022-03-14 VITALS — BP 110/86 | HR 67 | Temp 97.8°F | Resp 17 | Ht 62.0 in | Wt 140.8 lb

## 2022-03-14 DIAGNOSIS — L820 Inflamed seborrheic keratosis: Secondary | ICD-10-CM | POA: Diagnosis not present

## 2022-03-14 NOTE — Progress Notes (Signed)
   Subjective:    Patient ID: Jennifer Willis, female    DOB: 1970-11-08, 51 y.o.   MRN: [id number]  HPI Mole check- pt reports it is 'itchy' and 'scary'.  Sxs started ~3 weeks ago.  Pt reports area has been enlarging x3 months prior to becoming bothersome.  Area is scaling, crusty.  No bleeding.  No hx of similar.   Review of Systems For ROS see HPI     Objective:   Physical Exam Constitutional:      General: She is not in acute distress.    Appearance: Normal appearance. She is not ill-appearing.  HENT:     Head: Normocephalic and atraumatic.  Skin:    General: Skin is warm and dry.     Findings: Lesion (2cm crusted, inflammed seborrheic keratosis on R posterior thigh) present.  Neurological:     General: No focal deficit present.     Mental Status: She is alert and oriented to person, place, and time.  Psychiatric:        Mood and Affect: Mood normal.        Behavior: Behavior normal.        Thought Content: Thought content normal.           Assessment & Plan:  Seborrheic Keratosis- new.  Pt is very concerned as her mom passed from cancer.  Reassurance provided that this is a typically benign process.  Since it is inflamed and irritated, will refer to derm for removal.  Pt expressed understanding and is in agreement w/ plan.

## 2022-03-14 NOTE — Patient Instructions (Signed)
Follow up as needed or as scheduled This is a seborrheic keratosis and it may look bad but it's benign We'll call you with your dermatology appt Call with any questions or concerns Enjoy the rest of your summer!!

## 2022-11-09 ENCOUNTER — Other Ambulatory Visit (HOSPITAL_COMMUNITY)
Admission: RE | Admit: 2022-11-09 | Discharge: 2022-11-09 | Disposition: A | Discharge status: Home / Self Care | Payer: Commercial Managed Care - HMO | Source: Ambulatory Visit | Attending: Family Medicine | Admitting: Family Medicine

## 2022-11-09 ENCOUNTER — Encounter: Payer: Self-pay | Admitting: Family Medicine

## 2022-11-09 ENCOUNTER — Ambulatory Visit (INDEPENDENT_AMBULATORY_CARE_PROVIDER_SITE_OTHER): Payer: Commercial Managed Care - HMO | Admitting: Family Medicine

## 2022-11-09 VITALS — BP 122/80 | HR 69 | Temp 98.0°F | Resp 17 | Ht 62.0 in | Wt 126.0 lb

## 2022-11-09 DIAGNOSIS — Z124 Encounter for screening for malignant neoplasm of cervix: Secondary | ICD-10-CM | POA: Insufficient documentation

## 2022-11-09 DIAGNOSIS — Z0000 Encounter for general adult medical examination without abnormal findings: Secondary | ICD-10-CM

## 2022-11-09 DIAGNOSIS — E785 Hyperlipidemia, unspecified: Secondary | ICD-10-CM

## 2022-11-09 DIAGNOSIS — N841 Polyp of cervix uteri: Secondary | ICD-10-CM

## 2022-11-09 DIAGNOSIS — E559 Vitamin D deficiency, unspecified: Secondary | ICD-10-CM

## 2022-11-09 LAB — HEPATIC FUNCTION PANEL
ALT: 71 U/L — ABNORMAL HIGH (ref 0–35)
AST: 57 U/L — ABNORMAL HIGH (ref 0–37)
Albumin: 4.7 g/dL (ref 3.5–5.2)
Alkaline Phosphatase: 82 U/L (ref 39–117)
Bilirubin, Direct: 0.1 mg/dL (ref 0.0–0.3)
Total Bilirubin: 0.6 mg/dL (ref 0.2–1.2)
Total Protein: 8.4 g/dL — ABNORMAL HIGH (ref 6.0–8.3)

## 2022-11-09 LAB — CBC WITH DIFFERENTIAL/PLATELET
Basophils Absolute: 0.1 10*3/uL (ref 0.0–0.1)
Basophils Relative: 1.5 % (ref 0.0–3.0)
Eosinophils Absolute: 0.2 10*3/uL (ref 0.0–0.7)
Eosinophils Relative: 2.2 % (ref 0.0–5.0)
HCT: 40.7 % (ref 36.0–46.0)
Hemoglobin: 13.4 g/dL (ref 12.0–15.0)
Lymphocytes Relative: 32.8 % (ref 12.0–46.0)
Lymphs Abs: 2.6 10*3/uL (ref 0.7–4.0)
MCHC: 32.9 g/dL (ref 30.0–36.0)
MCV: 77.1 fl — ABNORMAL LOW (ref 78.0–100.0)
Monocytes Absolute: 0.7 10*3/uL (ref 0.1–1.0)
Monocytes Relative: 9 % (ref 3.0–12.0)
Neutro Abs: 4.4 10*3/uL (ref 1.4–7.7)
Neutrophils Relative %: 54.5 % (ref 43.0–77.0)
Platelets: 264 10*3/uL (ref 150.0–400.0)
RBC: 5.28 Mil/uL — ABNORMAL HIGH (ref 3.87–5.11)
RDW: 17 % — ABNORMAL HIGH (ref 11.5–15.5)
WBC: 8 10*3/uL (ref 4.0–10.5)

## 2022-11-09 LAB — VITAMIN D 25 HYDROXY (VIT D DEFICIENCY, FRACTURES): VITD: 53.63 ng/mL (ref 30.00–100.00)

## 2022-11-09 LAB — TSH: TSH: 1.61 u[IU]/mL (ref 0.35–5.50)

## 2022-11-09 LAB — BASIC METABOLIC PANEL
BUN: 12 mg/dL (ref 6–23)
CO2: 31 mEq/L (ref 19–32)
Calcium: 10.4 mg/dL (ref 8.4–10.5)
Chloride: 99 mEq/L (ref 96–112)
Creatinine, Ser: 0.65 mg/dL (ref 0.40–1.20)
GFR: 100.9 mL/min (ref >60.00)
Glucose, Bld: 102 mg/dL — ABNORMAL HIGH (ref 70–99)
Potassium: 4.1 mEq/L (ref 3.5–5.1)
Sodium: 138 mEq/L (ref 135–145)

## 2022-11-09 LAB — LIPID PANEL
Cholesterol: 191 mg/dL (ref 0–200)
HDL: 51.7 mg/dL (ref >39.00)
LDL Cholesterol: 104 mg/dL — ABNORMAL HIGH (ref 0–99)
NonHDL: 139.77
Total CHOL/HDL Ratio: 4
Triglycerides: 180 mg/dL — ABNORMAL HIGH (ref 0.0–149.0)
VLDL: 36 mg/dL (ref 0.0–40.0)

## 2022-11-09 NOTE — Assessment & Plan Note (Signed)
Check labs and replete prn. 

## 2022-11-09 NOTE — Assessment & Plan Note (Signed)
Pt's PE WNL w/ exception of hyperpigmentation of lower lip- dentist is following per pt report and is thought to be due to her retainers- and cervical polyp.  Pap collected.  UTD on mammo.  Check labs.  Anticipatory guidance provided.

## 2022-11-09 NOTE — Progress Notes (Signed)
   Subjective:    Patient ID: Jennifer Willis, female    DOB: 11-May-1971, 52 y.o.   MRN: [id number]  HPI CPE- due for pap.  UTD on mammogram, colonoscopy, Tdap.  Pt reports feeling well  Patient Care Team    Relationship Specialty Notifications Start End  Midge Minium, MD PCP - General Family Medicine  04/01/12     Health Maintenance  Topic Date Due   PAP SMEAR-Modifier  09/30/2019   MAMMOGRAM  01/21/2023   DTaP/Tdap/Td (2 - Td or Tdap) 09/29/2026   COLONOSCOPY (Pts 45-75yrs Insurance coverage will need to be confirmed)  02/29/2028   Hepatitis C Screening  Completed   HIV Screening  Completed   HPV VACCINES  Aged Out   INFLUENZA VACCINE  Discontinued   COVID-19 Vaccine  Discontinued   Zoster Vaccines- Shingrix  Discontinued      Review of Systems Patient reports no vision/ hearing changes, adenopathy,fever, persistant/recurrent hoarseness , swallowing issues, chest pain, palpitations, edema, persistant/recurrent cough, hemoptysis, dyspnea (rest/exertional/paroxysmal nocturnal), gastrointestinal bleeding (melena, rectal bleeding), abdominal pain, significant heartburn, bowel changes, GU symptoms (dysuria, hematuria, incontinence), Gyn symptoms (abnormal  bleeding, pain),  syncope, focal weakness, memory loss, numbness & tingling, skin/nail changes, abnormal bruising or bleeding, anxiety, or depression.   + 15 lb weight loss + hair loss    Objective:   Physical Exam  General Appearance:    Alert, cooperative, no distress, appears stated age  Head:    Normocephalic, without obvious abnormality, atraumatic  Eyes:    PERRL, conjunctiva/corneas clear, EOM's intact both eyes  Ears:    Normal TM's and external ear canals, both ears  Nose:   Nares normal, septum midline, mucosa normal, no drainage    or sinus tenderness  Throat:   Bottom lip hyperpigmented.  Mucosa, and tongue normal; teeth and gums normal  Neck:   Supple, symmetrical, trachea midline, no adenopathy;    Thyroid:  no enlargement/tenderness/nodules  Back:     Symmetric, no curvature, ROM normal, no CVA tenderness  Lungs:     Clear to auscultation bilaterally, respirations unlabored  Chest Wall:    No tenderness or deformity   Heart:    Regular rate and rhythm, S1 and S2 normal, no murmur, rub   or gallop  Breast Exam:    No tenderness, masses, or nipple abnormality  Abdomen:     Soft, non-tender, bowel sounds active all four quadrants,    no masses, no organomegaly  Genitalia:    External genitalia normal, cervical polyp, no CMT, uterus in normal size and position, adnexa w/out mass or tenderness, mucosa pink and moist, no lesions or discharge present  Rectal:    Normal external appearance  Extremities:   Extremities normal, atraumatic, no cyanosis or edema  Pulses:   2+ and symmetric all extremities  Skin:   Skin color, texture, turgor normal, no rashes or lesions  Lymph nodes:   Cervical, supraclavicular, and axillary nodes normal  Neurologic:   CNII-XII intact, normal strength, sensation and reflexes    throughout          Assessment & Plan:

## 2022-11-09 NOTE — Assessment & Plan Note (Signed)
Pap collected.  Cervical polyp present.  Refer to GYN.  Pt expressed understanding and is in agreement w/ plan.

## 2022-11-09 NOTE — Patient Instructions (Addendum)
Follow up in 1 year or as needed We'll notify you of your lab results and make any changes if needed Keep up the good work on healthy diet and regular exercise- you look great! We'll call you to schedule your GYN for the polyp on the cervix Call with any questions or concerns Stay Safe!  Stay Healthy! I'm so sorry for your loss.  Please let me know if we can help in any way.

## 2022-11-10 ENCOUNTER — Telehealth: Payer: Self-pay

## 2022-11-10 NOTE — Telephone Encounter (Signed)
-----   Message from Midge Minium, MD sent at 11/10/2022  7:30 AM EDT ----- Labs look good!  Liver functions are stable over the last year- great news!  No changes at this time

## 2022-11-10 NOTE — Telephone Encounter (Signed)
Informed pt of lab results  

## 2022-11-16 LAB — CYTOLOGY - PAP
Comment: NEGATIVE
Diagnosis: NEGATIVE
Diagnosis: REACTIVE
High risk HPV: NEGATIVE

## 2022-11-21 ENCOUNTER — Telehealth: Payer: Self-pay

## 2022-11-21 NOTE — Telephone Encounter (Signed)
Informed pt of lab results  

## 2022-11-21 NOTE — Telephone Encounter (Signed)
-----   Message from Midge Minium, MD sent at 11/20/2022  8:21 PM EDT ----- Normal pap- great news!

## 2022-11-24 IMAGING — MG MM DIGITAL SCREENING BILAT W/ TOMO AND CAD
8 series · 9 of 24 positions shown · non-contrast
Comparison: Previous exam(s).

CLINICAL DATA: Screening.

EXAM:
DIGITAL SCREENING BILATERAL MAMMOGRAM WITH TOMOSYNTHESIS AND CAD
TECHNIQUE: Bilateral screening digital craniocaudal and mediolateral oblique
mammograms were obtained. Bilateral screening digital breast
tomosynthesis was performed. The images were evaluated with
computer-aided detection.

[R CC synth-2D]
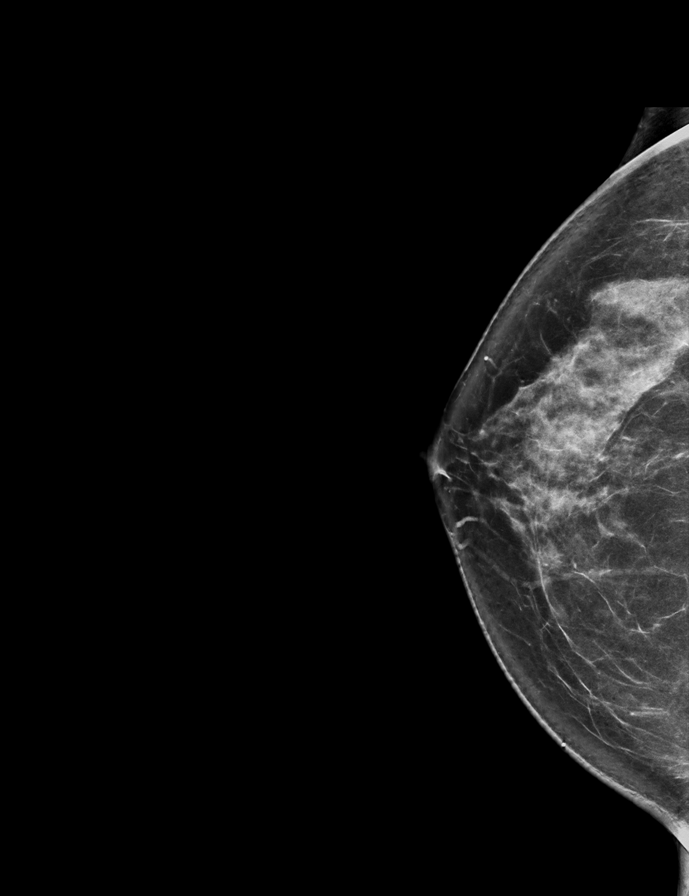

[L CC synth-2D]
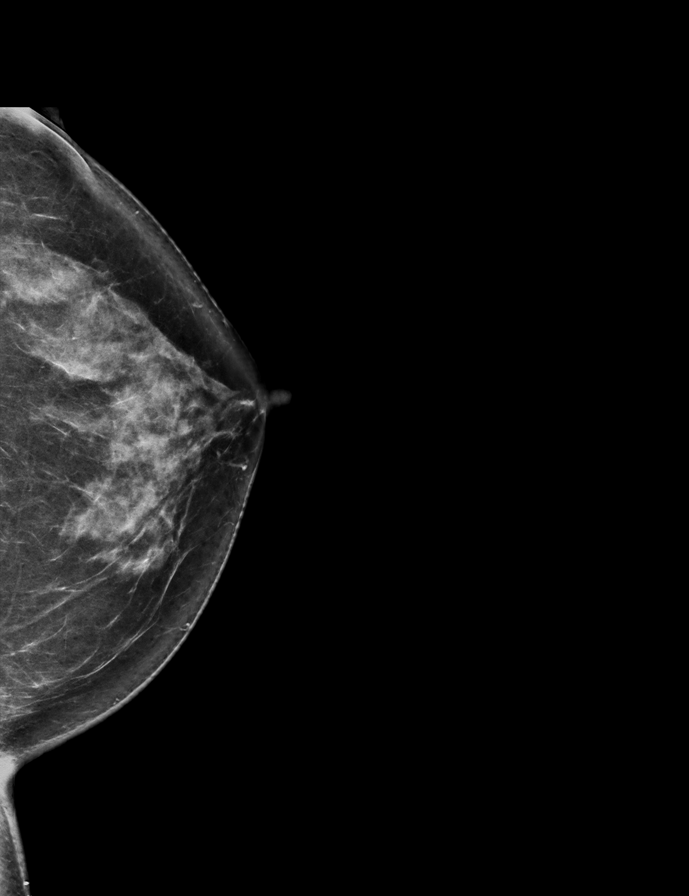

[L MLO synth-2D]
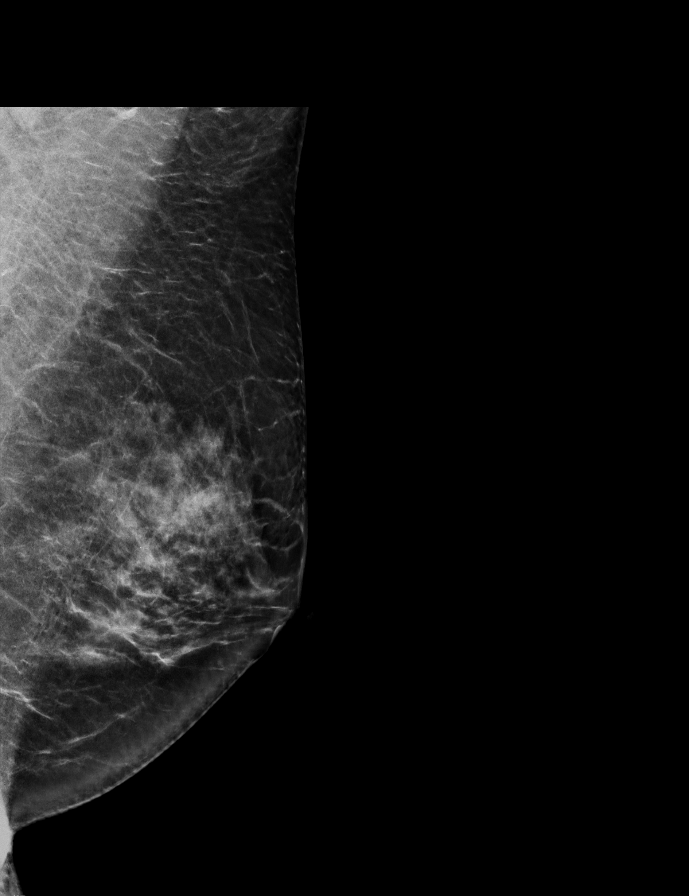

[R MLO synth-2D]
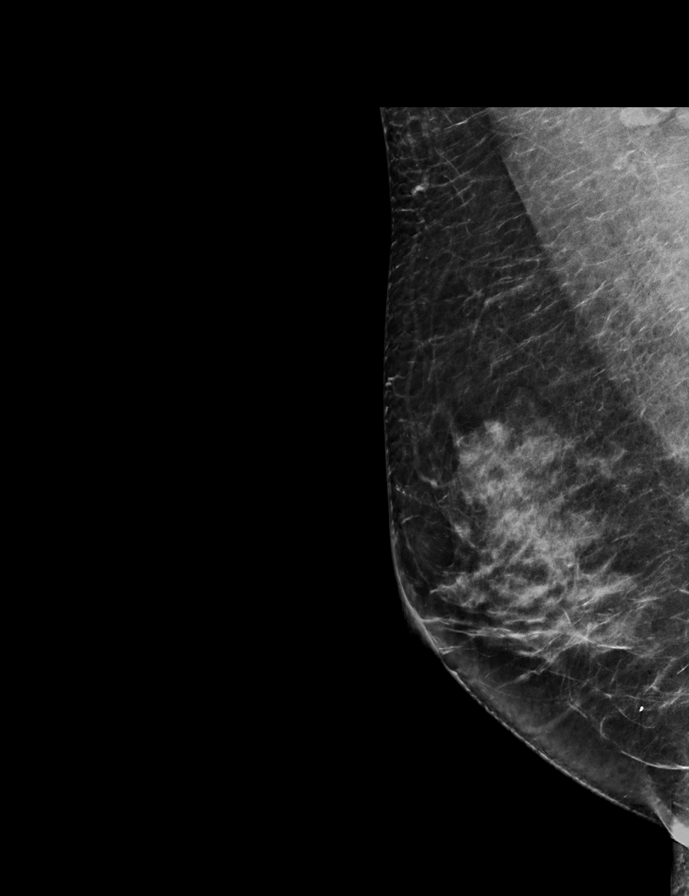

[L CC tomo · 2 of 74 frames shown]
[frame 24/74]
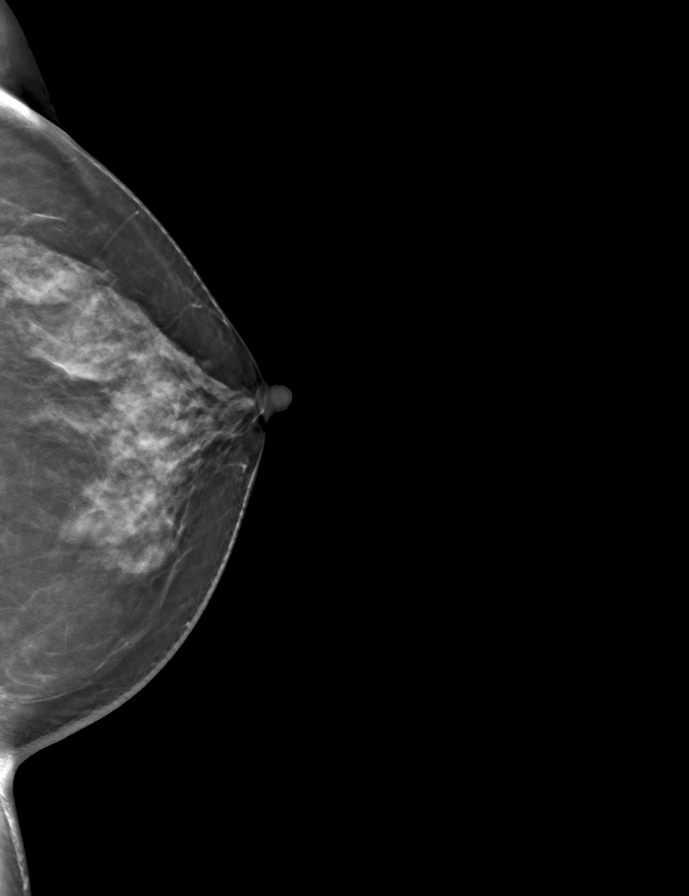
[frame 37/74]
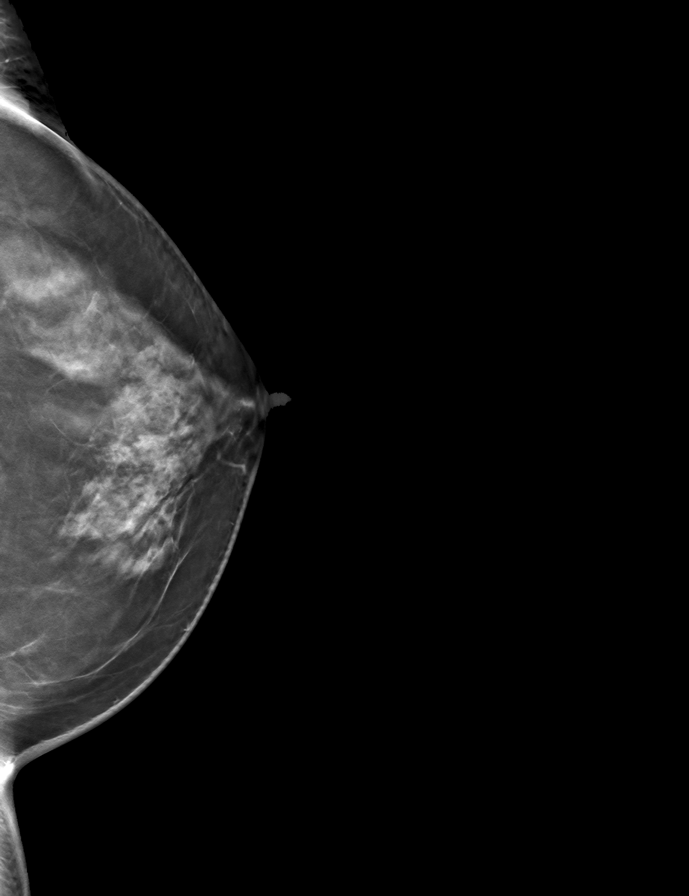

[L MLO tomo · tomo slice 37/74.0]
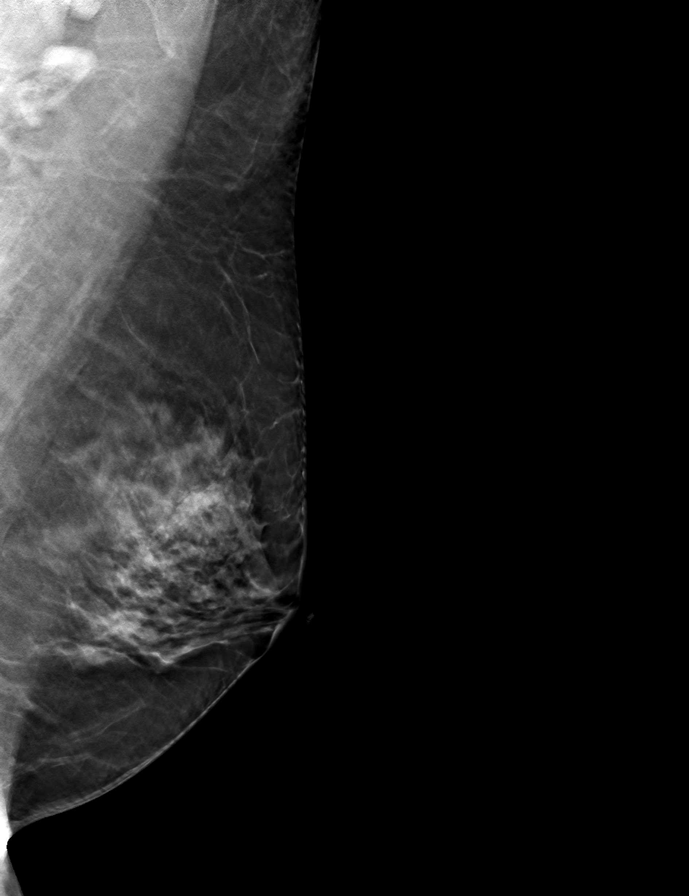

[R CC tomo · tomo slice 37/74.0]
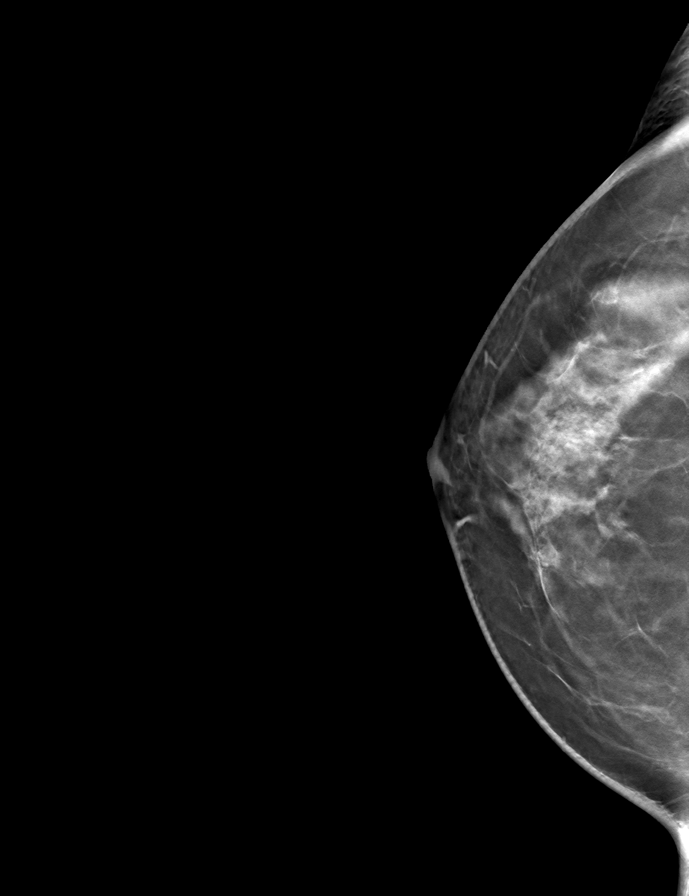

[R MLO tomo · tomo slice 36/71.0]
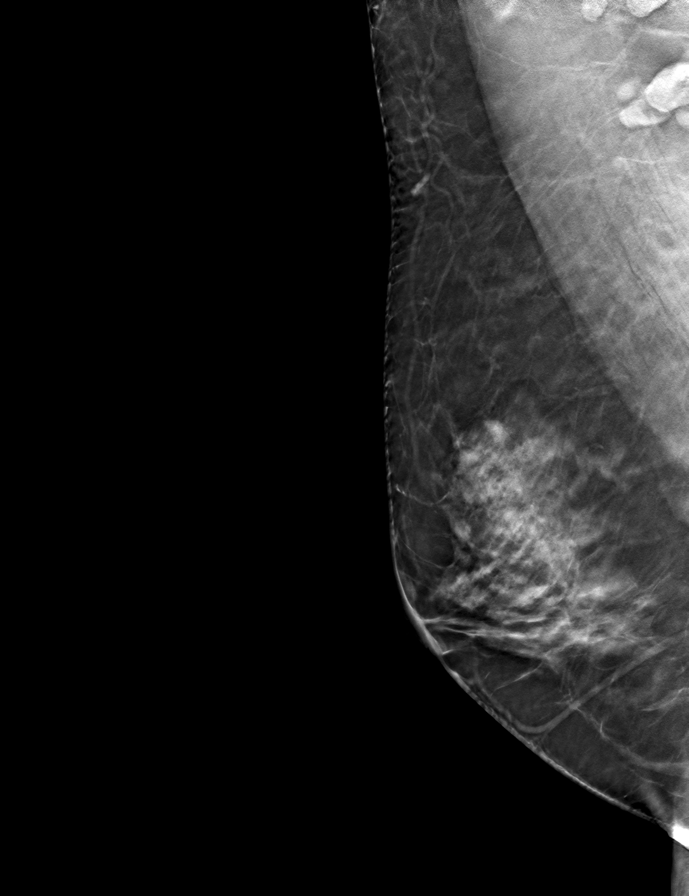

[9 of 24 positions shown; findings below may reference images not displayed]

ACR Breast Density Category c: The breast tissue is heterogeneously
dense, which may obscure small masses.
FINDINGS: There are no findings suspicious for malignancy. The images were
evaluated with computer-aided detection.
IMPRESSION: No mammographic evidence of malignancy. A result letter of this
screening mammogram will be mailed directly to the patient.

RECOMMENDATION:
Screening mammogram in one year. (Code:T4-5-GWO)

BI-RADS CATEGORY  1: Negative.

## 2023-06-20 ENCOUNTER — Ambulatory Visit: Payer: Commercial Managed Care - HMO | Admitting: Family Medicine

## 2023-06-20 ENCOUNTER — Encounter: Payer: Self-pay | Admitting: Family Medicine

## 2023-06-20 VITALS — BP 124/68 | HR 77 | Temp 97.8°F | Ht 62.0 in | Wt 136.1 lb

## 2023-06-20 DIAGNOSIS — M67442 Ganglion, left hand: Secondary | ICD-10-CM

## 2023-06-20 NOTE — Patient Instructions (Signed)
Follow up as needed or as scheduled We'll call you to schedule your hand appt [facility] if there is pain Call with any questions or concerns Hang in there!!! Happy Fall!!!

## 2023-06-20 NOTE — Progress Notes (Signed)
   Subjective:    Patient ID: Jennifer Willis, female    DOB: [birth year], 52 y.o.   MRN: [id number]  HPI Bump on finger- L hand, index finger at DIP joint.  Sxs started 'at least 2 months' ago.  Thought it would improve.  Area is enlarging.  Area doesn't hurt to touch but does send some shooting pain into forearm.  No hx of similar.     Review of Systems For ROS see HPI     Objective:   Physical Exam Vitals reviewed.  Constitutional:      General: She is not in acute distress.    Appearance: Normal appearance. She is not ill-appearing.  Cardiovascular:     Pulses: Normal pulses.  Musculoskeletal:        General: Deformity (firm, mobile swelling located lateral to L first DIP) present.  Skin:    General: Skin is warm and dry.  Neurological:     General: No focal deficit present.     Mental Status: She is alert and oriented to person, place, and time.  Psychiatric:        Mood and Affect: Mood normal.        Behavior: Behavior normal.        Thought Content: Thought content normal.           Assessment & Plan:  Digital cyst- new.  Area is firm.  Minimal TTP but is interfering w/ some fine motor tasks and pain will radiate into forearm at times.  Refer to hand for complete evaluation and tx.

## 2023-11-13 ENCOUNTER — Encounter: Payer: Commercial Managed Care - HMO | Admitting: Family Medicine

## 2024-01-18 ENCOUNTER — Ambulatory Visit (INDEPENDENT_AMBULATORY_CARE_PROVIDER_SITE_OTHER): Admitting: Family Medicine

## 2024-01-18 ENCOUNTER — Encounter: Payer: Self-pay | Admitting: Family Medicine

## 2024-01-18 VITALS — BP 128/84 | HR 76 | Temp 98.3°F | Ht 63.0 in | Wt 136.8 lb

## 2024-01-18 DIAGNOSIS — Z0000 Encounter for general adult medical examination without abnormal findings: Secondary | ICD-10-CM | POA: Diagnosis not present

## 2024-01-18 DIAGNOSIS — E559 Vitamin D deficiency, unspecified: Secondary | ICD-10-CM | POA: Diagnosis not present

## 2024-01-18 DIAGNOSIS — Z1231 Encounter for screening mammogram for malignant neoplasm of breast: Secondary | ICD-10-CM

## 2024-01-18 DIAGNOSIS — E785 Hyperlipidemia, unspecified: Secondary | ICD-10-CM | POA: Diagnosis not present

## 2024-01-18 LAB — CBC WITH DIFFERENTIAL/PLATELET
Basophils Absolute: 0.1 10*3/uL (ref 0.0–0.1)
Basophils Relative: 0.8 % (ref 0.0–3.0)
Eosinophils Absolute: 0.1 10*3/uL (ref 0.0–0.7)
Eosinophils Relative: 1.2 % (ref 0.0–5.0)
HCT: 39.8 % (ref 36.0–46.0)
Hemoglobin: 13.2 g/dL (ref 12.0–15.0)
Lymphocytes Relative: 20.8 % (ref 12.0–46.0)
Lymphs Abs: 2.5 10*3/uL (ref 0.7–4.0)
MCHC: 33.3 g/dL (ref 30.0–36.0)
MCV: 78.5 fl (ref 78.0–100.0)
Monocytes Absolute: 0.9 10*3/uL (ref 0.1–1.0)
Monocytes Relative: 7.8 % (ref 3.0–12.0)
Neutro Abs: 8.3 10*3/uL — ABNORMAL HIGH (ref 1.4–7.7)
Neutrophils Relative %: 69.4 % (ref 43.0–77.0)
Platelets: 293 10*3/uL (ref 150.0–400.0)
RBC: 5.07 Mil/uL (ref 3.87–5.11)
RDW: 14.3 % (ref 11.5–15.5)
WBC: 12 10*3/uL — ABNORMAL HIGH (ref 4.0–10.5)

## 2024-01-18 LAB — BASIC METABOLIC PANEL WITH GFR
BUN: 13 mg/dL (ref 6–23)
CO2: 28 meq/L (ref 19–32)
Calcium: 9.6 mg/dL (ref 8.4–10.5)
Chloride: 99 meq/L (ref 96–112)
Creatinine, Ser: 0.69 mg/dL (ref 0.40–1.20)
GFR: 98.63 mL/min (ref >60.00)
Glucose, Bld: 130 mg/dL — ABNORMAL HIGH (ref 70–99)
Potassium: 3.9 meq/L (ref 3.5–5.1)
Sodium: 137 meq/L (ref 135–145)

## 2024-01-18 LAB — HEPATIC FUNCTION PANEL
ALT: 86 U/L — ABNORMAL HIGH (ref 0–35)
AST: 74 U/L — ABNORMAL HIGH (ref 0–37)
Albumin: 4.6 g/dL (ref 3.5–5.2)
Alkaline Phosphatase: 100 U/L (ref 39–117)
Bilirubin, Direct: 0.2 mg/dL (ref 0.0–0.3)
Total Bilirubin: 0.9 mg/dL (ref 0.2–1.2)
Total Protein: 8.2 g/dL (ref 6.0–8.3)

## 2024-01-18 LAB — LIPID PANEL
Cholesterol: 150 mg/dL (ref 0–200)
HDL: 38.4 mg/dL — ABNORMAL LOW (ref >39.00)
LDL Cholesterol: 89 mg/dL (ref 0–99)
NonHDL: 111.53
Total CHOL/HDL Ratio: 4
Triglycerides: 115 mg/dL (ref 0.0–149.0)
VLDL: 23 mg/dL (ref 0.0–40.0)

## 2024-01-18 LAB — VITAMIN D 25 HYDROXY (VIT D DEFICIENCY, FRACTURES): VITD: 47.64 ng/mL (ref 30.00–100.00)

## 2024-01-18 LAB — TSH: TSH: 2.11 u[IU]/mL (ref 0.35–5.50)

## 2024-01-18 NOTE — Assessment & Plan Note (Signed)
 Pt's PE WNL.  UTD on pap, colonoscopy, Tdap.  Due for mammo- ordered.  Check labs

## 2024-01-18 NOTE — Patient Instructions (Signed)
 Follow up in 1 year or as needed We'll notify you of your lab results and make any changes if needed Keep up the good work on healthy diet and regular exercise- you look great!!! They should call you to schedule your mammogram- the order is in! Call with any questions or concerns Stay Safe!  Stay Healthy! HAPPY BELATED BIRTHDAY!!!

## 2024-01-18 NOTE — Progress Notes (Signed)
   Subjective:    Patient ID: Jennifer Willis, female    DOB: 12/27/1970, 53 y.o.   MRN: [id number]  HPI CPE- due for mammo.  UTD on pap, Tdap, colonoscopy.  Patient Care Team    Relationship Specialty Notifications Start End  Jess Morita, MD PCP - General Family Medicine  04/01/12     Health Maintenance  Topic Date Due   MAMMOGRAM  01/21/2023   COVID-19 Vaccine (2 - Moderna risk series) 02/03/2024 (Originally 01/24/2020)   Zoster Vaccines- Shingrix (1 of 2) 04/19/2024 (Originally 12/20/1988)   INFLUENZA VACCINE  03/21/2024   DTaP/Tdap/Td (2 - Td or Tdap) 09/29/2026   Cervical Cancer Screening (HPV/Pap Cotest)  11/09/2027   Colonoscopy  02/29/2028   Hepatitis C Screening  Completed   HIV Screening  Completed   HPV VACCINES  Aged Out   Meningococcal B Vaccine  Aged Out     Review of Systems Patient reports no vision/ hearing changes, adenopathy,fever, weight change,  persistant/recurrent hoarseness , swallowing issues, chest pain, palpitations, edema, persistant/recurrent cough, hemoptysis, dyspnea (rest/exertional/paroxysmal nocturnal), gastrointestinal bleeding (melena, rectal bleeding), abdominal pain, significant heartburn, bowel changes, GU symptoms (dysuria, hematuria, incontinence), Gyn symptoms (abnormal  bleeding, pain),  syncope, focal weakness, memory loss, numbness & tingling, skin/hair/nail changes, abnormal bruising or bleeding, anxiety, or depression.     Objective:   Physical Exam General Appearance:    Alert, cooperative, no distress, appears stated age  Head:    Normocephalic, without obvious abnormality, atraumatic  Eyes:    PERRL, conjunctiva/corneas clear, EOM's intact both eyes  Ears:    Normal TM's and external ear canals, both ears  Nose:   Nares normal, septum midline, mucosa normal, no drainage    or sinus tenderness  Throat:   Lips, mucosa, and tongue normal; teeth and gums normal  Neck:   Supple, symmetrical, trachea midline, no adenopathy;     Thyroid : no enlargement/tenderness/nodules  Back:     Symmetric, no curvature, ROM normal, no CVA tenderness  Lungs:     Clear to auscultation bilaterally, respirations unlabored  Chest Wall:    No tenderness or deformity   Heart:    Regular rate and rhythm, S1 and S2 normal, no murmur, rub   or gallop  Breast Exam:    Deferred to mammo  Abdomen:     Soft, non-tender, bowel sounds active all four quadrants,    no masses, no organomegaly  Genitalia:    Deferred  Rectal:    Extremities:   Extremities normal, atraumatic, no cyanosis or edema  Pulses:   2+ and symmetric all extremities  Skin:   Skin color, texture, turgor normal, no rashes or lesions  Lymph nodes:   Cervical, supraclavicular, and axillary nodes normal  Neurologic:   CNII-XII intact, normal strength, sensation and reflexes    throughout          Assessment & Plan:

## 2024-01-21 ENCOUNTER — Ambulatory Visit: Payer: Self-pay | Admitting: Family Medicine

## 2024-01-21 DIAGNOSIS — R7309 Other abnormal glucose: Secondary | ICD-10-CM

## 2024-01-21 DIAGNOSIS — D72829 Elevated white blood cell count, unspecified: Secondary | ICD-10-CM

## 2024-01-21 DIAGNOSIS — R7989 Other specified abnormal findings of blood chemistry: Secondary | ICD-10-CM

## 2024-01-22 ENCOUNTER — Ambulatory Visit (INDEPENDENT_AMBULATORY_CARE_PROVIDER_SITE_OTHER)

## 2024-01-22 DIAGNOSIS — R7309 Other abnormal glucose: Secondary | ICD-10-CM | POA: Diagnosis not present

## 2024-01-22 LAB — HEMOGLOBIN A1C: Hgb A1c MFr Bld: 7.8 % — ABNORMAL HIGH (ref 4.6–6.5)

## 2024-01-27 ENCOUNTER — Ambulatory Visit: Payer: Self-pay | Admitting: Family Medicine

## 2024-01-28 ENCOUNTER — Other Ambulatory Visit: Payer: Self-pay

## 2024-01-28 MED ORDER — METFORMIN HCL 500 MG PO TABS: 500.0000 mg | ORAL_TABLET | Freq: Two times a day (BID) | ORAL | 3 refills | Status: DC

## 2024-01-28 NOTE — Progress Notes (Signed)
 Metrformin has been sent in 1 month OV has been made

## 2024-01-28 NOTE — Telephone Encounter (Signed)
-----   Message from Laymon Priest sent at 01/27/2024  5:10 PM EDT ----- Your labs show that you now have diabetes.  Based on this, we need to start Metformin 500mg  twice daily (#60, 3 refills) while working on low carb/low sugar diet.  When someone has diabetes, we need to follow them more regularly- every 3-4 months.  You also need to get a yearly eye exam and we will more closely monitor sugar, kidney function, etc.  Please schedule an appt in 1 month to make sure the new medication is working for you and we can answer any questions you may have

## 2024-01-31 ENCOUNTER — Ambulatory Visit
Admission: RE | Admit: 2024-01-31 | Discharge: 2024-01-31 | Disposition: A | Discharge status: Home / Self Care | Source: Ambulatory Visit | Attending: Family Medicine | Admitting: Family Medicine

## 2024-01-31 DIAGNOSIS — Z1231 Encounter for screening mammogram for malignant neoplasm of breast: Secondary | ICD-10-CM

## 2024-02-12 ENCOUNTER — Other Ambulatory Visit (INDEPENDENT_AMBULATORY_CARE_PROVIDER_SITE_OTHER)

## 2024-02-12 DIAGNOSIS — D72829 Elevated white blood cell count, unspecified: Secondary | ICD-10-CM

## 2024-02-12 DIAGNOSIS — R7989 Other specified abnormal findings of blood chemistry: Secondary | ICD-10-CM

## 2024-02-12 LAB — HEPATIC FUNCTION PANEL
ALT: 64 U/L — ABNORMAL HIGH (ref 0–35)
AST: 43 U/L — ABNORMAL HIGH (ref 0–37)
Albumin: 4.4 g/dL (ref 3.5–5.2)
Alkaline Phosphatase: 68 U/L (ref 39–117)
Bilirubin, Direct: 0.1 mg/dL (ref 0.0–0.3)
Total Bilirubin: 0.5 mg/dL (ref 0.2–1.2)
Total Protein: 7.6 g/dL (ref 6.0–8.3)

## 2024-02-12 LAB — CBC WITH DIFFERENTIAL/PLATELET
Basophils Absolute: 0.1 10*3/uL (ref 0.0–0.1)
Basophils Relative: 1.1 % (ref 0.0–3.0)
Eosinophils Absolute: 0.2 10*3/uL (ref 0.0–0.7)
Eosinophils Relative: 3.2 % (ref 0.0–5.0)
HCT: 40 % (ref 36.0–46.0)
Hemoglobin: 13.1 g/dL (ref 12.0–15.0)
Lymphocytes Relative: 38.4 % (ref 12.0–46.0)
Lymphs Abs: 2.9 10*3/uL (ref 0.7–4.0)
MCHC: 32.9 g/dL (ref 30.0–36.0)
MCV: 78.5 fl (ref 78.0–100.0)
Monocytes Absolute: 0.5 10*3/uL (ref 0.1–1.0)
Monocytes Relative: 7 % (ref 3.0–12.0)
Neutro Abs: 3.8 10*3/uL (ref 1.4–7.7)
Neutrophils Relative %: 50.3 % (ref 43.0–77.0)
Platelets: 281 10*3/uL (ref 150.0–400.0)
RBC: 5.09 Mil/uL (ref 3.87–5.11)
RDW: 14.2 % (ref 11.5–15.5)
WBC: 7.6 10*3/uL (ref 4.0–10.5)

## 2024-03-05 ENCOUNTER — Ambulatory Visit: Admitting: Family Medicine

## 2024-04-03 ENCOUNTER — Ambulatory Visit: Admitting: Family Medicine

## 2024-04-03 ENCOUNTER — Encounter: Payer: Self-pay | Admitting: Family Medicine

## 2024-04-03 ENCOUNTER — Other Ambulatory Visit

## 2024-04-03 VITALS — BP 120/70 | HR 63 | Temp 98.0°F | Wt 132.6 lb

## 2024-04-03 DIAGNOSIS — Z7984 Long term (current) use of oral hypoglycemic drugs: Secondary | ICD-10-CM | POA: Diagnosis not present

## 2024-04-03 DIAGNOSIS — E119 Type 2 diabetes mellitus without complications: Secondary | ICD-10-CM

## 2024-04-03 LAB — BASIC METABOLIC PANEL WITH GFR
BUN: 19 mg/dL (ref 6–23)
CO2: 29 meq/L (ref 19–32)
Calcium: 9.7 mg/dL (ref 8.4–10.5)
Chloride: 101 meq/L (ref 96–112)
Creatinine, Ser: 0.68 mg/dL (ref 0.40–1.20)
GFR: 98.83 mL/min (ref >60.00)
Glucose, Bld: 102 mg/dL — ABNORMAL HIGH (ref 70–99)
Potassium: 3.6 meq/L (ref 3.5–5.1)
Sodium: 138 meq/L (ref 135–145)

## 2024-04-03 LAB — MICROALBUMIN / CREATININE URINE RATIO
Creatinine,U: 172.9 mg/dL
Microalb Creat Ratio: 34.2 mg/g — ABNORMAL HIGH (ref 0.0–30.0)
Microalb, Ur: 5.9 mg/dL — ABNORMAL HIGH (ref 0.0–1.9)

## 2024-04-03 NOTE — Progress Notes (Signed)
   Subjective:    Patient ID: Jennifer Willis, female    DOB: Feb 02, 1971, 53 y.o.   MRN: [id number]  HPI DM- new dx at last visit.  Last A1C 7.8%  on Metformin  500mg  BID.  Due for foot exam, microalbumin, eye exam.  Down 5 lbs since last visit.  Pt reports feeling good- exercising more, has changed diet.  No CP, SOB, HA's, visual changes, abd pain, N/V.  No numbness/tingling of hands/feet.  Denies symptomatic lows.  Needs to schedule eye exam.   Review of Systems For ROS see HPI     Objective:   Physical Exam Vitals reviewed.  Constitutional:      General: She is not in acute distress.    Appearance: Normal appearance. She is well-developed. She is not ill-appearing.  HENT:     Head: Normocephalic and atraumatic.  Eyes:     Conjunctiva/sclera: Conjunctivae normal.     Pupils: Pupils are equal, round, and reactive to light.  Neck:     Thyroid : No thyromegaly.  Cardiovascular:     Rate and Rhythm: Normal rate and regular rhythm.     Pulses: Normal pulses.     Heart sounds: Normal heart sounds. No murmur heard. Pulmonary:     Effort: Pulmonary effort is normal. No respiratory distress.     Breath sounds: Normal breath sounds.  Abdominal:     General: There is no distension.     Palpations: Abdomen is soft.     Tenderness: There is no abdominal tenderness.  Musculoskeletal:     Cervical back: Normal range of motion and neck supple.     Right lower leg: No edema.     Left lower leg: No edema.  Lymphadenopathy:     Cervical: No cervical adenopathy.  Skin:    General: Skin is warm and dry.  Neurological:     General: No focal deficit present.     Mental Status: She is alert and oriented to person, place, and time.  Psychiatric:        Mood and Affect: Mood normal.        Behavior: Behavior normal.        Thought Content: Thought content normal.           Assessment & Plan:

## 2024-04-03 NOTE — Assessment & Plan Note (Signed)
 New.  At CPE her glucose was elevated and add on A1C was 7.8%  She has since started Metformin  500mg  BID w/o difficulty.  She is exercising more and has changed her diet.  She is currently asymptomatic.  Will schedule eye exam.  Foot exam done today.  Repeat A1C and microalbumin ordered.  Will follow closely.

## 2024-04-03 NOTE — Patient Instructions (Signed)
 Follow up in 3-4 months to recheck sugar We'll notify you of your lab results and make any changes if needed Keep up the good work on healthy diet and regular exercise- you look FABULOUS!!! Schedule your eye exam Call with any questions or concerns Stay Safe!  Stay Healthy! I'm so proud of you and the changes you made!!!

## 2024-04-04 ENCOUNTER — Ambulatory Visit: Payer: Self-pay | Admitting: Family Medicine

## 2024-04-04 LAB — HEMOGLOBIN A1C
Est. average glucose Bld gHb Est-mCnc: 148 mg/dL
Hgb A1c MFr Bld: 6.8 % — ABNORMAL HIGH (ref 4.8–5.6)

## 2024-04-04 MED ORDER — EMPAGLIFLOZIN 10 MG PO TABS: 10.0000 mg | ORAL_TABLET | Freq: Every day | ORAL | 3 refills | Status: DC

## 2024-07-16 ENCOUNTER — Other Ambulatory Visit: Payer: Self-pay | Admitting: Family Medicine

## 2024-08-05 ENCOUNTER — Encounter: Payer: Self-pay | Admitting: Family Medicine

## 2024-08-05 ENCOUNTER — Ambulatory Visit: Admitting: Family Medicine

## 2024-08-05 VITALS — BP 128/70 | HR 63 | Temp 97.9°F | Ht 63.0 in | Wt 127.2 lb

## 2024-08-05 DIAGNOSIS — E119 Type 2 diabetes mellitus without complications: Secondary | ICD-10-CM | POA: Diagnosis not present

## 2024-08-05 DIAGNOSIS — Z7984 Long term (current) use of oral hypoglycemic drugs: Secondary | ICD-10-CM | POA: Diagnosis not present

## 2024-08-05 LAB — BASIC METABOLIC PANEL WITH GFR
BUN: 19 mg/dL (ref 6–23)
CO2: 30 meq/L (ref 19–32)
Calcium: 10.1 mg/dL (ref 8.4–10.5)
Chloride: 101 meq/L (ref 96–112)
Creatinine, Ser: 0.74 mg/dL (ref 0.40–1.20)
GFR: 91.59 mL/min (ref >60.00)
Glucose, Bld: 103 mg/dL — ABNORMAL HIGH (ref 70–99)
Potassium: 3.6 meq/L (ref 3.5–5.1)
Sodium: 139 meq/L (ref 135–145)

## 2024-08-05 LAB — CBC WITH DIFFERENTIAL/PLATELET
Basophils Absolute: 0.1 K/uL (ref 0.0–0.1)
Basophils Relative: 1.2 % (ref 0.0–3.0)
Eosinophils Absolute: 0.2 K/uL (ref 0.0–0.7)
Eosinophils Relative: 3.3 % (ref 0.0–5.0)
HCT: 42.9 % (ref 36.0–46.0)
Hemoglobin: 14.3 g/dL (ref 12.0–15.0)
Lymphocytes Relative: 37.1 % (ref 12.0–46.0)
Lymphs Abs: 2.6 K/uL (ref 0.7–4.0)
MCHC: 33.3 g/dL (ref 30.0–36.0)
MCV: 77.9 fl — ABNORMAL LOW (ref 78.0–100.0)
Monocytes Absolute: 0.6 K/uL (ref 0.1–1.0)
Monocytes Relative: 9.2 % (ref 3.0–12.0)
Neutro Abs: 3.5 K/uL (ref 1.4–7.7)
Neutrophils Relative %: 49.2 % (ref 43.0–77.0)
Platelets: 289 K/uL (ref 150.0–400.0)
RBC: 5.51 Mil/uL — ABNORMAL HIGH (ref 3.87–5.11)
RDW: 14.2 % (ref 11.5–15.5)
WBC: 7 K/uL (ref 4.0–10.5)

## 2024-08-05 LAB — LIPID PANEL
Cholesterol: 171 mg/dL (ref 28–200)
HDL: 50.2 mg/dL (ref >39.00)
LDL Cholesterol: 96 mg/dL (ref 10–99)
NonHDL: 120.45
Total CHOL/HDL Ratio: 3
Triglycerides: 121 mg/dL (ref 10.0–149.0)
VLDL: 24.2 mg/dL (ref 0.0–40.0)

## 2024-08-05 LAB — HEPATIC FUNCTION PANEL
ALT: 57 U/L — ABNORMAL HIGH (ref 3–35)
AST: 39 U/L — ABNORMAL HIGH (ref 5–37)
Albumin: 4.5 g/dL (ref 3.5–5.2)
Alkaline Phosphatase: 82 U/L (ref 39–117)
Bilirubin, Direct: 0.1 mg/dL (ref 0.1–0.3)
Total Bilirubin: 0.8 mg/dL (ref 0.2–1.2)
Total Protein: 8.1 g/dL (ref 6.0–8.3)

## 2024-08-05 LAB — HEMOGLOBIN A1C: Hgb A1c MFr Bld: 6.5 % (ref 4.6–6.5)

## 2024-08-05 LAB — TSH: TSH: 1.62 u[IU]/mL (ref 0.35–5.50)

## 2024-08-05 NOTE — Patient Instructions (Signed)
 Schedule your complete physical in 6 months We'll notify you of your lab results and make any changes if needed Keep up the good work on healthy diet and regular exercise- you look great! Schedule your eye exam at your convenience and have them send me a copy of the report Call with any questions or concerns Stay Safe!  Stay Healthy! Happy Holidays!!

## 2024-08-05 NOTE — Assessment & Plan Note (Signed)
 Chronic problem.  Currently on Jardiance  10mg  daily w/o difficulty.  UTD on fot exam, microalbumin.  Due for eye exam.  She is down 6 lbs since last visit.  Currently asymptomatic.  Check labs.  Adjust meds prn

## 2024-08-05 NOTE — Progress Notes (Signed)
° °  Subjective:    Patient ID: Jennifer Willis, female    DOB: Dec 25, 1970, 53 y.o.   MRN: [id number]  HPI DM- chronic problem.  On Jardiance  10mg  daily.  Stopped the Metformin  when we started the Jardiance  due to proteinuria.  UTD on foot exam.  Due for eye exam.  She is down 6 lbs since last visit.  Last A1C 6.8%.  No CP, SOB, HA's, visual changes, abd pain, N/V.  No numbness/tingling of hands/feet.  Denies symptomatic lows.     Review of Systems For ROS see HPI     Objective:   Physical Exam Constitutional:      General: She is not in acute distress.    Appearance: Normal appearance. She is well-developed. She is not ill-appearing.  HENT:     Head: Normocephalic and atraumatic.  Eyes:     Conjunctiva/sclera: Conjunctivae normal.     Pupils: Pupils are equal, round, and reactive to light.  Neck:     Thyroid : No thyromegaly.  Cardiovascular:     Rate and Rhythm: Normal rate and regular rhythm.     Pulses: Normal pulses.     Heart sounds: Normal heart sounds. No murmur heard. Pulmonary:     Effort: Pulmonary effort is normal. No respiratory distress.     Breath sounds: Normal breath sounds.  Abdominal:     General: There is no distension.     Palpations: Abdomen is soft.     Tenderness: There is no abdominal tenderness.  Musculoskeletal:     Cervical back: Normal range of motion and neck supple.     Right lower leg: No edema.     Left lower leg: No edema.  Lymphadenopathy:     Cervical: No cervical adenopathy.  Skin:    General: Skin is warm and dry.  Neurological:     General: No focal deficit present.     Mental Status: She is alert and oriented to person, place, and time.  Psychiatric:        Mood and Affect: Mood normal.        Behavior: Behavior normal.        Thought Content: Thought content normal.           Assessment & Plan:

## 2024-08-06 ENCOUNTER — Ambulatory Visit: Payer: Self-pay | Admitting: Family Medicine

## 2025-01-20 ENCOUNTER — Encounter: Admitting: Family Medicine
